# Patient Record
Sex: Female | Born: 1955 | Race: White | Hispanic: No | Marital: Married | Smoking: Former smoker
Health system: Southern US, Community
[De-identification: ages and names within clinical notes are randomized; demographics above are authoritative.]

## PROBLEM LIST (undated history)

## (undated) DIAGNOSIS — F32A Depression, unspecified: Secondary | ICD-10-CM

## (undated) DIAGNOSIS — Z8742 Personal history of other diseases of the female genital tract: Secondary | ICD-10-CM

## (undated) DIAGNOSIS — E039 Hypothyroidism, unspecified: Secondary | ICD-10-CM

## (undated) DIAGNOSIS — C4491 Basal cell carcinoma of skin, unspecified: Secondary | ICD-10-CM

## (undated) DIAGNOSIS — F419 Anxiety disorder, unspecified: Secondary | ICD-10-CM

## (undated) DIAGNOSIS — F329 Major depressive disorder, single episode, unspecified: Secondary | ICD-10-CM

## (undated) DIAGNOSIS — G43909 Migraine, unspecified, not intractable, without status migrainosus: Secondary | ICD-10-CM

## (undated) DIAGNOSIS — M1990 Unspecified osteoarthritis, unspecified site: Secondary | ICD-10-CM

## (undated) HISTORY — PX: CHOLECYSTECTOMY: SHX55

## (undated) HISTORY — PX: SKIN CANCER EXCISION: SHX779

## (undated) HISTORY — PX: TUBAL LIGATION: SHX77

## (undated) HISTORY — PX: COLONOSCOPY: SHX174

---
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
## 2016-11-27 HISTORY — PX: ABDOMINAL HYSTERECTOMY: SHX81

## 2018-09-26 ENCOUNTER — Ambulatory Visit: Payer: Self-pay | Admitting: Internal Medicine

## 2019-12-09 ENCOUNTER — Encounter: Payer: Self-pay | Admitting: Orthopedic Surgery

## 2019-12-09 NOTE — H&P (Signed)
TOTAL HIP ADMISSION H&P  Patient is admitted for right total hip arthroplasty.  Subjective:  Chief Complaint: right hip pain  HPI: Debbie Lin, 64 y.o. female, has a history of pain and functional disability in the right hip(s) due to arthritis and patient has failed non-surgical conservative treatments for greater than 12 weeks to include corticosteriod injections and activity modification.  Onset of symptoms was gradual starting several years ago with gradually worsening course since that time.The patient noted no past surgery on the right hip(s).  Patient currently rates pain in the right hip at 9 out of 10 with activity. Patient has worsening of pain with activity and weight bearing, pain that interfers with activities of daily living and instability. Patient has evidence of bone on bone arthritis throughout the right hip with large marginal osteophyte formation by imaging studies. This condition presents safety issues increasing the risk of falls. There is no current active infection.  There are no problems to display for this patient.  History reviewed. No pertinent past medical history.  History reviewed. No pertinent surgical history.  No current facility-administered medications for this encounter.   No current outpatient medications on file.   Not on File  Social History   Tobacco Use  . Smoking status: Not on file  Substance Use Topics  . Alcohol use: Not on file    History reviewed. No pertinent family history.   Review of Systems  Constitutional: Negative for chills and fever.  HENT: Negative for congestion, sore throat and tinnitus.   Eyes: Negative for photophobia and pain.  Respiratory: Negative for cough, shortness of breath and wheezing.   Cardiovascular: Negative for chest pain and palpitations.  Gastrointestinal: Negative for nausea and vomiting.  Genitourinary: Negative for dysuria, frequency and urgency.  Neurological: Negative for dizziness, weakness and  headaches.    Objective:  Physical Exam  Well nourished and well developed.  General: Alert and oriented x3, cooperative and pleasant, no acute distress.  Head: normocephalic, atraumatic, neck supple.  Eyes: EOMI.  Respiratory: breath sounds clear in all fields, no wheezing, rales, or rhonchi. Cardiovascular: Regular rate and rhythm, no murmurs, gallops or rubs.  Abdomen: non-tender to palpation and soft, normoactive bowel sounds. Musculoskeletal:  Right Hip Exam: ROM: Flexion to 100, Internal Rotation 0, External Rotation 20, and abduction 20 without discomfort. There is no tenderness over the greater trochanter bursa.  Calves soft and nontender. Motor function intact in LE. Strength 5/5 LE bilaterally. Neuro: Distal pulses 2+. Sensation to light touch intact in LE.   Vital signs in last 24 hours: Blood pressure: 130/88 mmHg Pulse: 72 bpm  Labs:   There is no height or weight on file to calculate BMI.   Imaging Review Plain radiographs demonstrate severe degenerative joint disease of the right hip(s). The bone quality appears to be adequate for age and reported activity level.  Assessment/Plan:  End stage arthritis, right hip(s)  The patient history, physical examination, clinical judgement of the provider and imaging studies are consistent with end stage degenerative joint disease of the right hip(s) and total hip arthroplasty is deemed medically necessary. The treatment options including medical management, injection therapy, arthroscopy and arthroplasty were discussed at length. The risks and benefits of total hip arthroplasty were presented and reviewed. The risks due to aseptic loosening, infection, stiffness, dislocation/subluxation,  thromboembolic complications and other imponderables were discussed.  The patient acknowledged the explanation, agreed to proceed with the plan and consent was signed. Patient is being admitted for inpatient treatment  for surgery, pain  control, PT, OT, prophylactic antibiotics, VTE prophylaxis, progressive ambulation and ADL's and discharge planning.The patient is planning to be discharged home.   Anticipated LOS equal to or greater than 2 midnights due to - Age 61 and older with one or more of the following:  - Obesity  - Expected need for hospital services (PT, OT, Nursing) required for safe  discharge  - Anticipated need for postoperative skilled nursing care or inpatient rehab  - Active co-morbidities: None OR   - Unanticipated findings during/Post Surgery: None  - Patient is a high risk of re-admission due to: None  Therapy Plans: HEP Disposition: Home with husband Planned DVT Prophylaxis: Aspirin 325 mg BID DME needed: None PCP: Dr. Quintin Alto (has been cleared) TXA: IV Allergies: NKDA Anesthesia Concerns: None BMI: 31.1 Last HgbA1c: 5.5%  - Patient was instructed on what medications to stop prior to surgery. - Follow-up visit in 2 weeks with Dr. Wynelle Link - Begin physical therapy following surgery - Pre-operative lab work as pre-surgical testing - Prescriptions will be provided in hospital at time of discharge  Theresa Duty, PA-C Orthopedic Surgery [facility] [place]

## 2019-12-10 ENCOUNTER — Encounter (HOSPITAL_COMMUNITY): Payer: Self-pay

## 2019-12-10 NOTE — Patient Instructions (Addendum)
DUE TO COVID-19 ONLY ONE VISITOR IS ALLOWED TO COME WITH YOU AND STAY IN THE WAITING ROOM ONLY DURING PRE OP AND PROCEDURE. THE ONE VISITOR MAY VISIT WITH YOU IN YOUR PRIVATE ROOM DURING VISITING HOURS ONLY!!   COVID SWAB TESTING MUST BE COMPLETED ON: Saturday, Jan. 16, 2021 at 10:10 AM   (Must self quarantine after testing. Follow instructions on handout.)             Your procedure is scheduled on: Wednesday, Jan. 20, 2021   Report to [facility] Main  Entrance    Report to admitting at 11:25 AM   Call this number if you have problems the morning of surgery [phone]   Do not eat food  :After Midnight.   May have liquids until 10:55 AM day of surgery   CLEAR LIQUID DIET  Foods Allowed                                                                     Foods Excluded  Water, Black Coffee and tea, regular and decaf                             liquids that you cannot  Plain Jell-O in any flavor  (No red)                                           see through such as: Fruit ices (not with fruit pulp)                                     milk, soups, orange juice  Iced Popsicles (No red)                                    All solid food Carbonated beverages, regular and diet                                    Apple juices Sports drinks like Gatorade (No red) Lightly seasoned clear broth or consume(fat free) Sugar, honey syrup  Sample Menu Breakfast                                Lunch                                     Supper Cranberry juice                    Beef broth                            Chicken broth Jell-O  Grape juice                           Apple juice Coffee or tea                        Jell-O                                      Popsicle                                                Coffee or tea                        Coffee or tea   Complete one Ensure drink the morning of surgery at 10:55 AM the day of  surgery.   Brush your teeth the morning of surgery.   Do NOT smoke after Midnight   Take these medicines the morning of surgery with A SIP OF WATER: None              You may not have any metal on your body including hair pins, jewelry, and body piercings             Do not wear make-up, lotions, powders, perfumes/cologne, or deodorant             Do not wear nail polish.  Do not shave  48 hours prior to surgery.              Do not bring valuables to the hospital. [place].   Contacts, dentures or bridgework may not be worn into surgery.   Bring small overnight bag day of surgery.    Special Instructions: Bring a copy of your healthcare power of attorney and living will documents         the day of surgery if you haven't scanned them in before.              Please read over the following fact sheets you were given:  [facility] - Preparing for Surgery Before surgery, you can play an important role.  Because skin is not sterile, your skin needs to be as free of germs as possible.  You can reduce the number of germs on your skin by washing with CHG (chlorahexidine gluconate) soap before surgery.  CHG is an antiseptic cleaner which kills germs and bonds with the skin to continue killing germs even after washing. Please DO NOT use if you have an allergy to CHG or antibacterial soaps.  If your skin becomes reddened/irritated stop using the CHG and inform your nurse when you arrive at Short Stay. Do not shave (including legs and underarms) for at least 48 hours prior to the first CHG shower.  You may shave your face/neck.  Please follow these instructions carefully:  1.  Shower with CHG Soap the night before surgery and the  morning of surgery.  2.  If you choose to wash your hair, wash your hair first as usual with your normal  shampoo.  3.  After you shampoo,  rinse your hair and body thoroughly to remove the shampoo.                              4.  Use CHG as you would any other liquid soap.  You can apply chg directly to the skin and wash.  Gently with a scrungie or clean washcloth.  5.  Apply the CHG Soap to your body ONLY FROM THE NECK DOWN.   Do   not use on face/ open                           Wound or open sores. Avoid contact with eyes, ears mouth and   genitals (private parts).                       Wash face,  Genitals (private parts) with your normal soap.             6.  Wash thoroughly, paying special attention to the area where your    surgery  will be performed.  7.  Thoroughly rinse your body with warm water from the neck down.  8.  DO NOT shower/wash with your normal soap after using and rinsing off the CHG Soap.                9.  Pat yourself dry with a clean towel.            10.  Wear clean pajamas.            11.  Place clean sheets on your bed the night of your first shower and do not  sleep with pets. Day of Surgery : Do not apply any lotions/deodorants the morning of surgery.  Please wear clean clothes to the hospital/surgery center.  FAILURE TO FOLLOW THESE INSTRUCTIONS MAY RESULT IN THE CANCELLATION OF YOUR SURGERY  PATIENT SIGNATURE_________________________________  NURSE SIGNATURE__________________________________  ________________________________________________________________________   Debbie Lin  An incentive spirometer is a tool that can help keep your lungs clear and active. This tool measures how well you are filling your lungs with each breath. Taking long deep breaths may help reverse or decrease the chance of developing breathing (pulmonary) problems (especially infection) following:  A long period of time when you are unable to move or be active. BEFORE THE PROCEDURE   If the spirometer includes an indicator to show your best effort, your nurse or respiratory therapist will set it to a desired goal.  If possible, sit up straight or lean slightly forward. Try not to  slouch.  Hold the incentive spirometer in an upright position. INSTRUCTIONS FOR USE  1. Sit on the edge of your bed if possible, or sit up as far as you can in bed or on a chair. 2. Hold the incentive spirometer in an upright position. 3. Breathe out normally. 4. Place the mouthpiece in your mouth and seal your lips tightly around it. 5. Breathe in slowly and as deeply as possible, raising the piston or the ball toward the top of the column. 6. Hold your breath for 3-5 seconds or for as long as possible. Allow the piston or ball to fall to the bottom of the column. 7. Remove the mouthpiece from your mouth and breathe out normally. 8. Rest for a few seconds and repeat Steps 1 through 7 at least 10 times every 1-2 hours when  you are awake. Take your time and take a few normal breaths between deep breaths. 9. The spirometer may include an indicator to show your best effort. Use the indicator as a goal to work toward during each repetition. 10. After each set of 10 deep breaths, practice coughing to be sure your lungs are clear. If you have an incision (the cut made at the time of surgery), support your incision when coughing by placing a pillow or rolled up towels firmly against it. Once you are able to get out of bed, walk around indoors and cough well. You may stop using the incentive spirometer when instructed by your caregiver.  RISKS AND COMPLICATIONS  Take your time so you do not get dizzy or light-headed.  If you are in pain, you may need to take or ask for pain medication before doing incentive spirometry. It is harder to take a deep breath if you are having pain. AFTER USE  Rest and breathe slowly and easily.  It can be helpful to keep track of a log of your progress. Your caregiver can provide you with a simple table to help with this. If you are using the spirometer at home, follow these instructions: [place] IF:   You are having difficultly using the spirometer.  You  have trouble using the spirometer as often as instructed.  Your pain medication is not giving enough relief while using the spirometer.  You develop fever of 100.5 F (38.1 C) or higher. SEEK IMMEDIATE MEDICAL CARE IF:   You cough up bloody sputum that had not been present before.  You develop fever of 102 F (38.9 C) or greater.  You develop worsening pain at or near the incision site. MAKE SURE YOU:   Understand these instructions.  Will watch your condition.  Will get help right away if you are not doing well or get worse. Document Released: 03/26/2007 Document Revised: 02/05/2012 Document Reviewed: 05/27/2007 [employer] Patient Information 2014 [employer], [place].   ________________________________________________________________________  WHAT IS A BLOOD TRANSFUSION? Blood Transfusion Information  A transfusion is the replacement of blood or some of its parts. Blood is made up of multiple cells which provide different functions.  Red blood cells carry oxygen and are used for blood loss replacement.  White blood cells fight against infection.  Platelets control bleeding.  Plasma helps clot blood.  Other blood products are available for specialized needs, such as hemophilia or other clotting disorders. BEFORE THE TRANSFUSION  Who gives blood for transfusions?   Healthy volunteers who are fully evaluated to make sure their blood is safe. This is blood bank blood. Transfusion therapy is the safest it has ever been in the practice of medicine. Before blood is taken from a donor, a complete history is taken to make sure that person has no history of diseases nor engages in risky social behavior (examples are intravenous drug use or sexual activity with multiple partners). The donor's travel history is screened to minimize risk of transmitting infections, such as malaria. The donated blood is tested for signs of infectious diseases, such as HIV and hepatitis. The blood is then  tested to be sure it is compatible with you in order to minimize the chance of a transfusion reaction. If you or a relative donates blood, this is often done in anticipation of surgery and is not appropriate for emergency situations. It takes many days to process the donated blood. RISKS AND COMPLICATIONS Although transfusion therapy is very safe and saves many  lives, the main dangers of transfusion include:   Getting an infectious disease.  Developing a transfusion reaction. This is an allergic reaction to something in the blood you were given. Every precaution is taken to prevent this. The decision to have a blood transfusion has been considered carefully by your caregiver before blood is given. Blood is not given unless the benefits outweigh the risks. AFTER THE TRANSFUSION  Right after receiving a blood transfusion, you will usually feel much better and more energetic. This is especially true if your red blood cells have gotten low (anemic). The transfusion raises the level of the red blood cells which carry oxygen, and this usually causes an energy increase.  The nurse administering the transfusion will monitor you carefully for complications. HOME CARE INSTRUCTIONS  No special instructions are needed after a transfusion. You may find your energy is better. Speak with your caregiver about any limitations on activity for underlying diseases you may have. SEEK MEDICAL CARE IF:   Your condition is not improving after your transfusion.  You develop redness or irritation at the intravenous (IV) site. SEEK IMMEDIATE MEDICAL CARE IF:  Any of the following symptoms occur over the next 12 hours:  Shaking chills.  You have a temperature by mouth above 102 F (38.9 C), not controlled by medicine.  Chest, back, or muscle pain.  People around you feel you are not acting correctly or are confused.  Shortness of breath or difficulty breathing.  Dizziness and fainting.  You get a rash or  develop hives.  You have a decrease in urine output.  Your urine turns a dark color or changes to pink, red, or brown. Any of the following symptoms occur over the next 10 days:  You have a temperature by mouth above 102 F (38.9 C), not controlled by medicine.  Shortness of breath.  Weakness after normal activity.  The white part of the eye turns yellow (jaundice).  You have a decrease in the amount of urine or are urinating less often.  Your urine turns a dark color or changes to pink, red, or brown. Document Released: 11/10/2000 Document Revised: 02/05/2012 Document Reviewed: 06/29/2008 [facility] Patient Information 2014 [place], [place].  _______________________________________________________________________

## 2019-12-13 ENCOUNTER — Other Ambulatory Visit (HOSPITAL_COMMUNITY)
Admission: RE | Admit: 2019-12-13 | Discharge: 2019-12-13 | Disposition: A | Discharge status: Home / Self Care | Payer: PRIVATE HEALTH INSURANCE | Source: Ambulatory Visit | Attending: Orthopedic Surgery | Admitting: Orthopedic Surgery

## 2019-12-13 DIAGNOSIS — Z20822 Contact with and (suspected) exposure to covid-19: Secondary | ICD-10-CM | POA: Insufficient documentation

## 2019-12-13 DIAGNOSIS — Z01812 Encounter for preprocedural laboratory examination: Secondary | ICD-10-CM | POA: Diagnosis not present

## 2019-12-14 LAB — NOVEL CORONAVIRUS, NAA (HOSP ORDER, SEND-OUT TO REF LAB; TAT 18-24 HRS): SARS-CoV-2, NAA: NOT DETECTED

## 2019-12-15 ENCOUNTER — Other Ambulatory Visit: Payer: Self-pay

## 2019-12-15 ENCOUNTER — Encounter (HOSPITAL_COMMUNITY)
Admission: RE | Admit: 2019-12-15 | Discharge: 2019-12-15 | Disposition: A | Discharge status: Home / Self Care | Payer: PRIVATE HEALTH INSURANCE | Source: Ambulatory Visit | Attending: Orthopedic Surgery | Admitting: Orthopedic Surgery

## 2019-12-15 ENCOUNTER — Encounter (HOSPITAL_COMMUNITY): Payer: Self-pay

## 2019-12-15 DIAGNOSIS — Z01812 Encounter for preprocedural laboratory examination: Secondary | ICD-10-CM | POA: Diagnosis not present

## 2019-12-15 HISTORY — DX: Depression, unspecified: F32.A

## 2019-12-15 HISTORY — DX: Anxiety disorder, unspecified: F41.9

## 2019-12-15 HISTORY — DX: Basal cell carcinoma of skin, unspecified: C44.91

## 2019-12-15 HISTORY — DX: Personal history of other diseases of the female genital tract: Z87.42

## 2019-12-15 HISTORY — DX: Unspecified osteoarthritis, unspecified site: M19.90

## 2019-12-15 HISTORY — DX: Hypothyroidism, unspecified: E03.9

## 2019-12-15 HISTORY — DX: Migraine, unspecified, not intractable, without status migrainosus: G43.909

## 2019-12-15 LAB — ABO/RH: ABO/RH(D): AB POS

## 2019-12-15 LAB — PROTIME-INR
INR: 1 (ref 0.8–1.2)
Prothrombin Time: 13.2 seconds (ref 11.4–15.2)

## 2019-12-15 LAB — COMPREHENSIVE METABOLIC PANEL
ALT: 14 U/L (ref 0–44)
AST: 19 U/L (ref 15–41)
Albumin: 4.6 g/dL (ref 3.5–5.0)
Alkaline Phosphatase: 66 U/L (ref 38–126)
Anion gap: 9 (ref 5–15)
BUN: 16 mg/dL (ref 8–23)
CO2: 28 mmol/L (ref 22–32)
Calcium: 9.4 mg/dL (ref 8.9–10.3)
Chloride: 102 mmol/L (ref 98–111)
Creatinine, Ser: 0.8 mg/dL (ref 0.44–1.00)
GFR calc Af Amer: >60 mL/min (ref >60)
GFR calc non Af Amer: >60 mL/min (ref >60)
Glucose, Bld: 97 mg/dL (ref 70–99)
Potassium: 4.5 mmol/L (ref 3.5–5.1)
Sodium: 139 mmol/L (ref 135–145)
Total Bilirubin: 0.9 mg/dL (ref 0.3–1.2)
Total Protein: 7.9 g/dL (ref 6.5–8.1)

## 2019-12-15 LAB — SURGICAL PCR SCREEN
MRSA, PCR: NEGATIVE
Staphylococcus aureus: POSITIVE — AB

## 2019-12-15 LAB — CBC
HCT: 42 % (ref 36.0–46.0)
Hemoglobin: 13.4 g/dL (ref 12.0–15.0)
MCH: 28.8 pg (ref 26.0–34.0)
MCHC: 31.9 g/dL (ref 30.0–36.0)
MCV: 90.3 fL (ref 80.0–100.0)
Platelets: 264 10*3/uL (ref 150–400)
RBC: 4.65 MIL/uL (ref 3.87–5.11)
RDW: 13.2 % (ref 11.5–15.5)
WBC: 7.4 10*3/uL (ref 4.0–10.5)
nRBC: 0 % (ref 0.0–0.2)

## 2019-12-15 LAB — APTT: aPTT: 28 seconds (ref 24–36)

## 2019-12-15 NOTE — Progress Notes (Signed)
PCP - Dr. Jeanelle Malling [facility] [place], [place] Cardiologist - N/A  Chest x-ray - greater than 1 year EKG - at Dr. Edythe Lynn office Stress Test - N/A ECHO - N/A Cardiac Cath - N/A  Sleep Study - N/A CPAP - N/A  Fasting Blood Sugar - N/A Checks Blood Sugar __N/A___ times a day  Blood Thinner Instructions:  N/A Aspirin Instructions: N/A Last Dose: N/A  Anesthesia review:  N/A  Patient denies shortness of breath, fever, cough and chest pain at PAT appointment   Patient verbalized understanding of instructions that were given to them at the PAT appointment. Patient was also instructed that they will need to review over the PAT instructions again at home before surgery.

## 2019-12-17 ENCOUNTER — Telehealth (HOSPITAL_COMMUNITY): Payer: Self-pay | Admitting: *Deleted

## 2019-12-17 ENCOUNTER — Encounter (HOSPITAL_COMMUNITY): Payer: Self-pay | Admitting: Orthopedic Surgery

## 2019-12-17 ENCOUNTER — Ambulatory Visit (HOSPITAL_COMMUNITY): Payer: PRIVATE HEALTH INSURANCE

## 2019-12-17 ENCOUNTER — Encounter (HOSPITAL_COMMUNITY)
Admission: RE | Disposition: A | Discharge status: Home / Self Care | Payer: Self-pay | Source: Ambulatory Visit | Attending: Orthopedic Surgery

## 2019-12-17 ENCOUNTER — Ambulatory Visit (HOSPITAL_COMMUNITY)
Admission: RE | Admit: 2019-12-17 | Discharge: 2019-12-18 | Disposition: A | Discharge status: Home / Self Care | Payer: PRIVATE HEALTH INSURANCE | Source: Ambulatory Visit | Attending: Orthopedic Surgery | Admitting: Orthopedic Surgery

## 2019-12-17 ENCOUNTER — Ambulatory Visit (HOSPITAL_COMMUNITY): Payer: PRIVATE HEALTH INSURANCE | Admitting: Physician Assistant

## 2019-12-17 DIAGNOSIS — Z87891 Personal history of nicotine dependence: Secondary | ICD-10-CM | POA: Insufficient documentation

## 2019-12-17 DIAGNOSIS — E039 Hypothyroidism, unspecified: Secondary | ICD-10-CM | POA: Diagnosis not present

## 2019-12-17 DIAGNOSIS — M1990 Unspecified osteoarthritis, unspecified site: Secondary | ICD-10-CM | POA: Insufficient documentation

## 2019-12-17 DIAGNOSIS — Z419 Encounter for procedure for purposes other than remedying health state, unspecified: Secondary | ICD-10-CM

## 2019-12-17 DIAGNOSIS — M1611 Unilateral primary osteoarthritis, right hip: Secondary | ICD-10-CM | POA: Diagnosis not present

## 2019-12-17 DIAGNOSIS — F419 Anxiety disorder, unspecified: Secondary | ICD-10-CM | POA: Insufficient documentation

## 2019-12-17 DIAGNOSIS — Z96641 Presence of right artificial hip joint: Secondary | ICD-10-CM

## 2019-12-17 DIAGNOSIS — F329 Major depressive disorder, single episode, unspecified: Secondary | ICD-10-CM | POA: Diagnosis not present

## 2019-12-17 DIAGNOSIS — Z96649 Presence of unspecified artificial hip joint: Secondary | ICD-10-CM

## 2019-12-17 DIAGNOSIS — M169 Osteoarthritis of hip, unspecified: Secondary | ICD-10-CM | POA: Diagnosis present

## 2019-12-17 HISTORY — PX: TOTAL HIP ARTHROPLASTY: SHX124

## 2019-12-17 LAB — TYPE AND SCREEN
ABO/RH(D): AB POS
Antibody Screen: NEGATIVE

## 2019-12-17 SURGERY — ARTHROPLASTY, HIP, TOTAL, ANTERIOR APPROACH
Anesthesia: Spinal | Site: Hip | Laterality: Right

## 2019-12-17 MED ORDER — DEXAMETHASONE SODIUM PHOSPHATE 10 MG/ML IJ SOLN
8.0000 mg | Freq: Once | INTRAMUSCULAR | Status: AC
  Administered 2019-12-17: 13:00:00 8 mg via INTRAVENOUS

## 2019-12-17 MED ORDER — ALPRAZOLAM 0.5 MG PO TABS: 0.5000 mg | ORAL_TABLET | Freq: Every evening | ORAL | Status: DC | PRN

## 2019-12-17 MED ORDER — FENTANYL CITRATE (PF) 100 MCG/2ML IJ SOLN
INTRAMUSCULAR | Status: AC
  Filled 2019-12-17: qty 2

## 2019-12-17 MED ORDER — LIDOCAINE 2% (20 MG/ML) 5 ML SYRINGE
INTRAMUSCULAR | Status: AC
  Filled 2019-12-17: qty 5

## 2019-12-17 MED ORDER — POVIDONE-IODINE 10 % EX SWAB: 2.0000 "application " | Freq: Once | CUTANEOUS | Status: DC

## 2019-12-17 MED ORDER — BUPIVACAINE HCL (PF) 0.25 % IJ SOLN
INTRAMUSCULAR | Status: AC
  Filled 2019-12-17: qty 30

## 2019-12-17 MED ORDER — METHOCARBAMOL 500 MG PO TABS
500.0000 mg | ORAL_TABLET | Freq: Four times a day (QID) | ORAL | Status: DC | PRN
  Administered 2019-12-17 – 2019-12-18 (×3): 500 mg via ORAL
  Filled 2019-12-17 (×3): qty 1

## 2019-12-17 MED ORDER — PROPOFOL 10 MG/ML IV BOLUS
INTRAVENOUS | Status: AC
  Filled 2019-12-17: qty 20

## 2019-12-17 MED ORDER — DIPHENHYDRAMINE HCL 12.5 MG/5ML PO ELIX: 12.5000 mg | ORAL_SOLUTION | ORAL | Status: DC | PRN

## 2019-12-17 MED ORDER — MIDAZOLAM HCL 2 MG/2ML IJ SOLN
INTRAMUSCULAR | Status: AC
  Filled 2019-12-17: qty 2

## 2019-12-17 MED ORDER — BUPIVACAINE HCL 0.25 % IJ SOLN
INTRAMUSCULAR | Status: DC | PRN
  Administered 2019-12-17: 30 mL

## 2019-12-17 MED ORDER — CEFAZOLIN SODIUM-DEXTROSE 2-4 GM/100ML-% IV SOLN
2.0000 g | Freq: Four times a day (QID) | INTRAVENOUS | Status: AC
  Administered 2019-12-17 – 2019-12-18 (×2): 2 g via INTRAVENOUS
  Filled 2019-12-17 (×2): qty 100

## 2019-12-17 MED ORDER — PHENOL 1.4 % MT LIQD: 1.0000 | OROMUCOSAL | Status: DC | PRN

## 2019-12-17 MED ORDER — CHLORHEXIDINE GLUCONATE 4 % EX LIQD: 60.0000 mL | Freq: Once | CUTANEOUS | Status: DC

## 2019-12-17 MED ORDER — ONDANSETRON HCL 4 MG/2ML IJ SOLN
INTRAMUSCULAR | Status: AC
  Filled 2019-12-17: qty 2

## 2019-12-17 MED ORDER — METOCLOPRAMIDE HCL 5 MG PO TABS: 5.0000 mg | ORAL_TABLET | Freq: Three times a day (TID) | ORAL | Status: DC | PRN

## 2019-12-17 MED ORDER — ALBUMIN HUMAN 5 % IV SOLN
INTRAVENOUS | Status: AC
  Filled 2019-12-17: qty 500

## 2019-12-17 MED ORDER — METOCLOPRAMIDE HCL 5 MG/ML IJ SOLN: 5.0000 mg | Freq: Three times a day (TID) | INTRAMUSCULAR | Status: DC | PRN

## 2019-12-17 MED ORDER — FENTANYL CITRATE (PF) 100 MCG/2ML IJ SOLN
INTRAMUSCULAR | Status: DC | PRN
  Administered 2019-12-17: 25 ug via INTRAVENOUS

## 2019-12-17 MED ORDER — HYDROCODONE-ACETAMINOPHEN 7.5-325 MG PO TABS: 1.0000 | ORAL_TABLET | ORAL | Status: DC | PRN

## 2019-12-17 MED ORDER — MENTHOL 3 MG MT LOZG: 1.0000 | LOZENGE | OROMUCOSAL | Status: DC | PRN

## 2019-12-17 MED ORDER — ACETAMINOPHEN 10 MG/ML IV SOLN
1000.0000 mg | Freq: Four times a day (QID) | INTRAVENOUS | Status: DC
  Administered 2019-12-17: 13:00:00 1000 mg via INTRAVENOUS
  Filled 2019-12-17: qty 100

## 2019-12-17 MED ORDER — HYDROCODONE-ACETAMINOPHEN 5-325 MG PO TABS
1.0000 | ORAL_TABLET | ORAL | Status: DC | PRN
  Administered 2019-12-17 – 2019-12-18 (×4): 2 via ORAL
  Filled 2019-12-17 (×4): qty 2

## 2019-12-17 MED ORDER — FLUOXETINE HCL 20 MG PO CAPS
40.0000 mg | ORAL_CAPSULE | Freq: Every day | ORAL | Status: DC
  Administered 2019-12-17: 20:00:00 40 mg via ORAL
  Filled 2019-12-17: qty 2

## 2019-12-17 MED ORDER — HYDROMORPHONE HCL 1 MG/ML IJ SOLN
INTRAMUSCULAR | Status: AC
  Filled 2019-12-17: qty 1

## 2019-12-17 MED ORDER — ACETAMINOPHEN 325 MG PO TABS: 325.0000 mg | ORAL_TABLET | Freq: Four times a day (QID) | ORAL | Status: DC | PRN

## 2019-12-17 MED ORDER — MIDAZOLAM HCL 5 MG/5ML IJ SOLN
INTRAMUSCULAR | Status: DC | PRN
  Administered 2019-12-17: 1 mg via INTRAVENOUS
  Administered 2019-12-17: 2 mg via INTRAVENOUS

## 2019-12-17 MED ORDER — SODIUM CHLORIDE 0.9 % IV SOLN: INTRAVENOUS | Status: DC

## 2019-12-17 MED ORDER — WATER FOR IRRIGATION, STERILE IR SOLN
Status: DC | PRN
  Administered 2019-12-17: 1000 mL

## 2019-12-17 MED ORDER — DOCUSATE SODIUM 100 MG PO CAPS
100.0000 mg | ORAL_CAPSULE | Freq: Two times a day (BID) | ORAL | Status: DC
  Administered 2019-12-17 – 2019-12-18 (×2): 100 mg via ORAL
  Filled 2019-12-17 (×2): qty 1

## 2019-12-17 MED ORDER — METHOCARBAMOL 500 MG IVPB - SIMPLE MED
500.0000 mg | Freq: Four times a day (QID) | INTRAVENOUS | Status: DC | PRN
  Administered 2019-12-17: 15:00:00 500 mg via INTRAVENOUS
  Filled 2019-12-17: qty 50

## 2019-12-17 MED ORDER — ONDANSETRON HCL 4 MG/2ML IJ SOLN: 4.0000 mg | Freq: Four times a day (QID) | INTRAMUSCULAR | Status: DC | PRN

## 2019-12-17 MED ORDER — MORPHINE SULFATE (PF) 4 MG/ML IV SOLN: 0.5000 mg | INTRAVENOUS | Status: DC | PRN

## 2019-12-17 MED ORDER — CEFAZOLIN SODIUM-DEXTROSE 2-4 GM/100ML-% IV SOLN
2.0000 g | INTRAVENOUS | Status: AC
  Administered 2019-12-17: 2 g via INTRAVENOUS
  Filled 2019-12-17: qty 100

## 2019-12-17 MED ORDER — ASPIRIN EC 325 MG PO TBEC
325.0000 mg | DELAYED_RELEASE_TABLET | Freq: Two times a day (BID) | ORAL | Status: DC
  Administered 2019-12-18: 08:00:00 325 mg via ORAL
  Filled 2019-12-17: qty 1

## 2019-12-17 MED ORDER — PHENYLEPHRINE HCL-NACL 10-0.9 MG/250ML-% IV SOLN
INTRAVENOUS | Status: DC | PRN
  Administered 2019-12-17: 10 ug/min via INTRAVENOUS

## 2019-12-17 MED ORDER — PROPOFOL 500 MG/50ML IV EMUL
INTRAVENOUS | Status: DC | PRN
  Administered 2019-12-17: 30 ug/kg/min via INTRAVENOUS

## 2019-12-17 MED ORDER — TRANEXAMIC ACID-NACL 1000-0.7 MG/100ML-% IV SOLN
1000.0000 mg | INTRAVENOUS | Status: AC
  Administered 2019-12-17: 12:00:00 1000 mg via INTRAVENOUS
  Filled 2019-12-17: qty 100

## 2019-12-17 MED ORDER — PROPOFOL 500 MG/50ML IV EMUL
INTRAVENOUS | Status: DC | PRN
  Administered 2019-12-17: 20 ug via INTRAVENOUS
  Administered 2019-12-17: 30 ug via INTRAVENOUS

## 2019-12-17 MED ORDER — HYDROMORPHONE HCL 1 MG/ML IJ SOLN
0.2500 mg | INTRAMUSCULAR | Status: DC | PRN
  Administered 2019-12-17: 15:00:00 0.5 mg via INTRAVENOUS

## 2019-12-17 MED ORDER — DEXAMETHASONE SODIUM PHOSPHATE 10 MG/ML IJ SOLN
INTRAMUSCULAR | Status: AC
  Filled 2019-12-17: qty 1

## 2019-12-17 MED ORDER — ONDANSETRON HCL 4 MG/2ML IJ SOLN
INTRAMUSCULAR | Status: DC | PRN
  Administered 2019-12-17: 4 mg via INTRAVENOUS

## 2019-12-17 MED ORDER — BUPIVACAINE IN DEXTROSE 0.75-8.25 % IT SOLN
INTRATHECAL | Status: DC | PRN
  Administered 2019-12-17: 1.8 mL via INTRATHECAL

## 2019-12-17 MED ORDER — PHENYLEPHRINE 40 MCG/ML (10ML) SYRINGE FOR IV PUSH (FOR BLOOD PRESSURE SUPPORT)
PREFILLED_SYRINGE | INTRAVENOUS | Status: AC
  Filled 2019-12-17: qty 10

## 2019-12-17 MED ORDER — METHOCARBAMOL 500 MG IVPB - SIMPLE MED
INTRAVENOUS | Status: AC
  Filled 2019-12-17: qty 50

## 2019-12-17 MED ORDER — LACTATED RINGERS IV SOLN: INTRAVENOUS | Status: DC

## 2019-12-17 MED ORDER — TRANEXAMIC ACID-NACL 1000-0.7 MG/100ML-% IV SOLN
1000.0000 mg | Freq: Once | INTRAVENOUS | Status: AC
  Administered 2019-12-17: 1000 mg via INTRAVENOUS
  Filled 2019-12-17: qty 100

## 2019-12-17 MED ORDER — MAGNESIUM CITRATE PO SOLN: 1.0000 | Freq: Once | ORAL | Status: DC | PRN

## 2019-12-17 MED ORDER — EPHEDRINE 5 MG/ML INJ
INTRAVENOUS | Status: AC
  Filled 2019-12-17: qty 10

## 2019-12-17 MED ORDER — ONDANSETRON HCL 4 MG PO TABS: 4.0000 mg | ORAL_TABLET | Freq: Four times a day (QID) | ORAL | Status: DC | PRN

## 2019-12-17 MED ORDER — BISACODYL 10 MG RE SUPP: 10.0000 mg | Freq: Every day | RECTAL | Status: DC | PRN

## 2019-12-17 MED ORDER — LEVOTHYROXINE SODIUM 50 MCG PO TABS
50.0000 ug | ORAL_TABLET | Freq: Every day | ORAL | Status: DC
  Administered 2019-12-17: 50 ug via ORAL
  Filled 2019-12-17: qty 1

## 2019-12-17 MED ORDER — POLYETHYLENE GLYCOL 3350 17 G PO PACK: 17.0000 g | PACK | Freq: Every day | ORAL | Status: DC | PRN

## 2019-12-17 MED ORDER — ALBUMIN HUMAN 5 % IV SOLN: INTRAVENOUS | Status: DC | PRN

## 2019-12-17 MED ORDER — 0.9 % SODIUM CHLORIDE (POUR BTL) OPTIME
TOPICAL | Status: DC | PRN
  Administered 2019-12-17: 1000 mL

## 2019-12-17 SURGICAL SUPPLY — 50 items
BAG DECANTER FOR FLEXI CONT (MISCELLANEOUS) IMPLANT
BAG ZIPLOCK 12X15 (MISCELLANEOUS) IMPLANT
BLADE SAG 18X100X1.27 (BLADE) ×3 IMPLANT
CLOSURE STERI-STRIP 1/2X4 (GAUZE/BANDAGES/DRESSINGS) ×1
CLOSURE WOUND 1/2 X4 (GAUZE/BANDAGES/DRESSINGS) ×1
CLSR STERI-STRIP ANTIMIC 1/2X4 (GAUZE/BANDAGES/DRESSINGS) ×1 IMPLANT
COVER PERINEAL POST (MISCELLANEOUS) ×3 IMPLANT
COVER SURGICAL LIGHT HANDLE (MISCELLANEOUS) ×3 IMPLANT
COVER WAND RF STERILE (DRAPES) IMPLANT
CUP ACETBLR 48 OD SECTOR II (Hips) ×2 IMPLANT
DECANTER SPIKE VIAL GLASS SM (MISCELLANEOUS) ×3 IMPLANT
DRAPE STERI IOBAN 125X83 (DRAPES) ×3 IMPLANT
DRAPE U-SHAPE 47X51 STRL (DRAPES) ×6 IMPLANT
DRESSING AQUACEL AG SP 3.5X6 (GAUZE/BANDAGES/DRESSINGS) IMPLANT
DRSG ADAPTIC 3X8 NADH LF (GAUZE/BANDAGES/DRESSINGS) ×3 IMPLANT
DRSG AQUACEL AG SP 3.5X6 (GAUZE/BANDAGES/DRESSINGS) ×3
DRSG MEPILEX BORDER 4X4 (GAUZE/BANDAGES/DRESSINGS) ×3 IMPLANT
DRSG MEPILEX BORDER 4X8 (GAUZE/BANDAGES/DRESSINGS) ×3 IMPLANT
DURAPREP 26ML APPLICATOR (WOUND CARE) ×3 IMPLANT
ELECT REM PT RETURN 15FT ADLT (MISCELLANEOUS) ×3 IMPLANT
EVACUATOR 1/8 PVC DRAIN (DRAIN) ×3 IMPLANT
GLOVE BIO SURGEON STRL SZ 6 (GLOVE) IMPLANT
GLOVE BIO SURGEON STRL SZ7 (GLOVE) IMPLANT
GLOVE BIO SURGEON STRL SZ8 (GLOVE) ×3 IMPLANT
GLOVE BIOGEL PI IND STRL 6.5 (GLOVE) IMPLANT
GLOVE BIOGEL PI IND STRL 7.0 (GLOVE) IMPLANT
GLOVE BIOGEL PI IND STRL 8 (GLOVE) ×1 IMPLANT
GLOVE BIOGEL PI INDICATOR 6.5 (GLOVE)
GLOVE BIOGEL PI INDICATOR 7.0 (GLOVE)
GLOVE BIOGEL PI INDICATOR 8 (GLOVE) ×2
GOWN STRL REUS W/TWL LRG LVL3 (GOWN DISPOSABLE) ×3 IMPLANT
GOWN STRL REUS W/TWL XL LVL3 (GOWN DISPOSABLE) IMPLANT
HEAD CERAMIC DELTA 28M 12/14P5 (Head) ×2 IMPLANT
HOLDER FOLEY CATH W/STRAP (MISCELLANEOUS) ×3 IMPLANT
KIT TURNOVER KIT A (KITS) IMPLANT
LINER MARATHON 28 48 (Hips) ×2 IMPLANT
MANIFOLD NEPTUNE II (INSTRUMENTS) ×3 IMPLANT
PACK ANTERIOR HIP CUSTOM (KITS) ×3 IMPLANT
PENCIL SMOKE EVACUATOR COATED (MISCELLANEOUS) ×3 IMPLANT
STEM FEMORAL SZ 5MM STD ACTIS (Stem) ×2 IMPLANT
STRIP CLOSURE SKIN 1/2X4 (GAUZE/BANDAGES/DRESSINGS) ×2 IMPLANT
SUT ETHIBOND NAB CT1 #1 30IN (SUTURE) ×3 IMPLANT
SUT MNCRL AB 4-0 PS2 18 (SUTURE) ×3 IMPLANT
SUT STRATAFIX 0 PDS 27 VIOLET (SUTURE) ×3
SUT VIC AB 2-0 CT1 27 (SUTURE) ×4
SUT VIC AB 2-0 CT1 TAPERPNT 27 (SUTURE) ×2 IMPLANT
SUTURE STRATFX 0 PDS 27 VIOLET (SUTURE) ×1 IMPLANT
SYR 50ML LL SCALE MARK (SYRINGE) IMPLANT
TRAY FOLEY MTR SLVR 16FR STAT (SET/KITS/TRAYS/PACK) ×3 IMPLANT
YANKAUER SUCT BULB TIP 10FT TU (MISCELLANEOUS) ×3 IMPLANT

## 2019-12-17 NOTE — Transfer of Care (Signed)
Immediate Anesthesia Transfer of Care Note  Patient: Debbie Lin  Procedure(s) Performed: TOTAL HIP ARTHROPLASTY ANTERIOR APPROACH (Right Hip)  Patient Location: PACU  Anesthesia Type:Spinal  Level of Consciousness: awake, oriented and patient cooperative  Airway & Oxygen Therapy: Patient Spontanous Breathing and Patient connected to face mask oxygen  Post-op Assessment: Report given to RN and Post -op Vital signs reviewed and stable  Post vital signs: Reviewed and stable  Last Vitals:  Vitals Value Taken Time  BP    Temp    Pulse 81 12/17/19 1350  Resp    SpO2 100 % 12/17/19 1350  Vitals shown include unvalidated device data.  Last Pain:  Vitals:   12/17/19 1139  TempSrc:   PainSc: 0-No pain         Complications: No apparent anesthesia complications

## 2019-12-17 NOTE — Anesthesia Preprocedure Evaluation (Addendum)
Anesthesia Evaluation  Patient identified by MRN, date of birth, ID band Patient awake    Reviewed: Allergy & Precautions, NPO status , Patient's Chart, lab work & pertinent test results  History of Anesthesia Complications (+) POST - OP SPINAL HEADACHE  Airway Mallampati: II  TM Distance: >3 FB     Dental   Pulmonary former smoker,    breath sounds clear to auscultation       Cardiovascular negative cardio ROS   Rhythm:Regular Rate:Normal     Neuro/Psych  Headaches, Anxiety Depression    GI/Hepatic negative GI ROS, Neg liver ROS,   Endo/Other  Hypothyroidism   Renal/GU      Musculoskeletal  (+) Arthritis ,   Abdominal   Peds  Hematology   Anesthesia Other Findings   Reproductive/Obstetrics                           Anesthesia Physical Anesthesia Plan  ASA: III  Anesthesia Plan: Spinal   Post-op Pain Management:    Induction: Intravenous  PONV Risk Score and Plan: 2 and Ondansetron and Midazolam  Airway Management Planned: Nasal Cannula and Simple Face Mask  Additional Equipment:   Intra-op Plan:   Post-operative Plan:   Informed Consent: I have reviewed the patients History and Physical, chart, labs and discussed the procedure including the risks, benefits and alternatives for the proposed anesthesia with the patient or authorized representative who has indicated his/her understanding and acceptance.     Dental advisory given  Plan Discussed with: CRNA and Anesthesiologist  Anesthesia Plan Comments:         Anesthesia Quick Evaluation

## 2019-12-17 NOTE — Interval H&P Note (Signed)
History and Physical Interval Note:  12/17/2019 11:59 AM  Debbie Lin  has presented today for surgery, with the diagnosis of right hip osteoarthritis.  The various methods of treatment have been discussed with the patient and family. After consideration of risks, benefits and other options for treatment, the patient has consented to  Procedure(s) with comments: [place] (Right) - 166min as a surgical intervention.  The patient's history has been reviewed, patient examined, no change in status, stable for surgery.  I have reviewed the patient's chart and labs.  Questions were answered to the patient's satisfaction.     Pilar Plate Makenzy Krist

## 2019-12-17 NOTE — Anesthesia Postprocedure Evaluation (Signed)
Anesthesia Post Note  Patient: Debbie Lin  Procedure(s) Performed: TOTAL HIP ARTHROPLASTY ANTERIOR APPROACH (Right Hip)     Patient location during evaluation: PACU Anesthesia Type: Spinal Level of consciousness: awake Pain management: pain level controlled Vital Signs Assessment: post-procedure vital signs reviewed and stable Respiratory status: spontaneous breathing Postop Assessment: no apparent nausea or vomiting Anesthetic complications: no    Last Vitals:  Vitals:   12/17/19 1507 12/17/19 1614  BP: 118/81 125/81  Pulse: 76 81  Resp:  16  Temp: 36.4 C 36.6 C  SpO2: 99% 100%    Last Pain:  Vitals:   12/17/19 1507  TempSrc: Oral  PainSc:                  [name]

## 2019-12-17 NOTE — Op Note (Signed)
OPERATIVE REPORT- TOTAL HIP ARTHROPLASTY   PREOPERATIVE DIAGNOSIS: Osteoarthritis of the Right hip.   POSTOPERATIVE DIAGNOSIS: Osteoarthritis of the Right  hip.   PROCEDURE: Right total hip arthroplasty, anterior approach.   SURGEON: Gaynelle Arabian, MD   ASSISTANT: Ardeen Jourdain, PA-C  ANESTHESIA:  Spinal  ESTIMATED BLOOD LOSS:-1000 mL    DRAINS: Hemovac x1.   COMPLICATIONS: None   CONDITION: PACU - hemodynamically stable.   BRIEF CLINICAL NOTE: Debbie Lin is a 64 y.o. female who has advanced end-  stage arthritis of their Right  hip with progressively worsening pain and  dysfunction.The patient has failed nonoperative management and presents for  total hip arthroplasty.   PROCEDURE IN DETAIL: After successful administration of spinal  anesthetic, the traction boots for the [facility] bed were placed on both  feet and the patient was placed onto the [facility] bed, boots placed into the leg  holders. The Right hip was then isolated from the perineum with plastic  drapes and prepped and draped in the usual sterile fashion. ASIS and  greater trochanter were marked and a oblique incision was made, starting  at about 1 cm lateral and 2 cm distal to the ASIS and coursing towards  the anterior cortex of the femur. The skin was cut with a 10 blade  through subcutaneous tissue to the level of the fascia overlying the  tensor fascia lata muscle. The fascia was then incised in line with the  incision at the junction of the anterior third and posterior 2/3rd. The  muscle was teased off the fascia and then the interval between the TFL  and the rectus was developed. The Hohmann retractor was then placed at  the top of the femoral neck over the capsule. The vessels overlying the  capsule were cauterized and the fat on top of the capsule was removed.  A Hohmann retractor was then placed anterior underneath the rectus  femoris to give exposure to the entire anterior capsule. A T-shaped   capsulotomy was performed. The edges were tagged and the femoral head  was identified.       Osteophytes are removed off the superior acetabulum.  The femoral neck was then cut in situ with an oscillating saw. Traction  was then applied to the left lower extremity utilizing the [facility]  traction. The femoral head was then removed. Retractors were placed  around the acetabulum and then circumferential removal of the labrum was  performed. Osteophytes were also removed. Reaming starts at 45 mm to  medialize and  Increased in 2 mm increments to 47 mm. We reamed in  approximately 40 degrees of abduction, 20 degrees anteversion. A 48 mm  pinnacle acetabular shell was then impacted in anatomic position under  fluoroscopic guidance with excellent purchase. We did not need to place  any additional dome screws. A 28 mm neutral + 4 marathon liner was then  placed into the acetabular shell.       The femoral lift was then placed along the lateral aspect of the femur  just distal to the vastus ridge. The leg was  externally rotated and capsule  was stripped off the inferior aspect of the femoral neck down to the  level of the lesser trochanter, this was done with electrocautery. The femur was lifted after this was performed. The  leg was then placed in an extended and adducted position essentially delivering the femur. We also removed the capsule superiorly and the piriformis from the piriformis  fossa to gain excellent exposure of the  proximal femur. Rongeur was used to remove some cancellous bone to get  into the lateral portion of the proximal femur for placement of the  initial starter reamer. The starter broaches was placed  the starter broach  and was shown to go down the center of the canal. Broaching  with the Actis system was then performed starting at size 0  coursing  Up to size 5. A size 5 had excellent torsional and rotational  and axial stability. The trial standard offset neck was then  placed  with a 28 + 5 trial head. The hip was then reduced. We confirmed that  the stem was in the canal both on AP and lateral x-rays. It also has excellent sizing. The hip was reduced with outstanding stability through full extension and full external rotation.. AP pelvis was taken and the leg lengths were measured and found to be equal. Hip was then dislocated again and the femoral head and neck removed. The  femoral broach was removed. Size 5 Actis stem with a standard offset  neck was then impacted into the femur following native anteversion. Has  excellent purchase in the canal. Excellent torsional and rotational and  axial stability. It is confirmed to be in the canal on AP and lateral  fluoroscopic views. The 28 + 5 ceramic head was placed and the hip  reduced with outstanding stability. Again AP pelvis was taken and it  confirmed that the leg lengths were equal. The wound was then copiously  irrigated with saline solution and the capsule reattached and repaired  with Ethibond suture. 30 ml of .25% Bupivicaine was  injected into the capsule and into the edge of the tensor fascia lata as well as subcutaneous tissue. The fascia overlying the tensor fascia lata was then closed with a running #1 V-Loc. Subcu was closed with interrupted 2-0 Vicryl and subcuticular running 4-0 Monocryl. Incision was cleaned  and dried. Steri-Strips and a bulky sterile dressing applied. Hemovac  drain was hooked to suction and then the patient was awakened and transported to  recovery in stable condition.        Please note that a surgical assistant was a medical necessity for this procedure to perform it in a safe and expeditious manner. Assistant was necessary to provide appropriate retraction of vital neurovascular structures and to prevent femoral fracture and allow for anatomic placement of the prosthesis.  Gaynelle Arabian, M.D.

## 2019-12-17 NOTE — Discharge Instructions (Addendum)
Dr. Gaynelle Arabian Total Joint Specialist [facility] [address], [place], [place] [address] [phone]  ANTERIOR APPROACH TOTAL HIP REPLACEMENT POSTOPERATIVE DIRECTIONS     Hip Rehabilitation, Guidelines Following Surgery  The results of a hip operation are greatly improved after range of motion and muscle strengthening exercises. Follow all safety measures which are given to protect your hip. If any of these exercises cause increased pain or swelling in your joint, decrease the amount until you are comfortable again. Then slowly increase the exercises. Call your caregiver if you have problems or questions.   BLOOD CLOT PREVENTION . Take a 325 mg Aspirin two times a day for three weeks following surgery. Then take an 81 mg Aspirin once a day for three weeks. Then discontinue Aspirin. Debbie Lin may resume your vitamins/supplements upon discharge from the hospital. . Do not take any NSAIDs (Advil, Aleve, Ibuprofen, Meloxicam, etc.) until you have discontinued the 325 mg Aspirin.  HOME CARE INSTRUCTIONS  . Remove items at home which could result in a fall. This includes throw rugs or furniture in walking pathways.   ICE to the affected hip as frequently as 20-30 minutes an hour and then as needed for pain and swelling. Continue to use ice on the hip for pain and swelling from surgery. You may notice swelling that will progress down to the foot and ankle. This is normal after surgery. Elevate the leg when you are not up walking on it.    Continue to use the breathing machine which will help keep your temperature down.  It is common for your temperature to cycle up and down following surgery, especially at night when you are not up moving around and exerting yourself.  The breathing machine keeps your lungs expanded and your temperature down.  DIET You may resume your previous home diet once your are discharged from the hospital.  DRESSING / WOUND CARE / SHOWERING . You may  remove the adhesive bandage 2 days following surgery. Leave the tape strips across the incision in place until your first follow-up appointment. Cover the incision with a 4x4 gauze and paper tape. Change the dressing daily with new gauze and paper tape for 7-10 days following surgery. . You may begin showering 3 days following surgery, but do not submerge the incision under water. . Allow water and soap to run over the incision, pat dry, and apply a fresh gauze dressing.  ACTIVITY . For the first 3-5 days, it is important to rest and keep the operative leg elevated. You should, as a general rule, rest for 50 minutes and walk/stretch for 10 minutes per hour. After 5 days, you may slowly increase activity as tolerated.  Marland Kitchen Perform the exercises you were provided twice a day for about 15-20 minutes each session. Begin these 2 days following surgery. . Walk with your walker as instructed. Use the walker until you are comfortable transitioning to a cane. Walk with the cane in the opposite hand of the operative leg. You may discontinue the cane once you are comfortable and walking steadily. . Avoid periods of inactivity such as sitting longer than an hour when not asleep. This helps prevent blood clots.  . Do not drive a car for 6 weeks or until released by your surgeon.  . Do not drive while taking narcotics.  TED HOSE STOCKINGS Wear the elastic stockings on both legs for three weeks following surgery during the day. You may remove them at night while sleeping.  WEIGHT BEARING Weight bearing as tolerated with assist device (walker, cane, etc) as directed, use it as long as suggested by your surgeon or therapist, typically at least 4-6 weeks.  POSTOPERATIVE CONSTIPATION PROTOCOL Constipation - defined medically as fewer than three stools per week and severe constipation as less than one stool per week.  One of the most common issues patients have following surgery is constipation.  Even if you have a  regular bowel pattern at home, your normal regimen is likely to be disrupted due to multiple reasons following surgery.  Combination of anesthesia, postoperative narcotics, change in appetite and fluid intake all can affect your bowels.  In order to avoid complications following surgery, here are some recommendations in order to help you during your recovery period.  . Colace (docusate) - Pick up an over-the-counter form of Colace or another stool softener and take twice a day as long as you are requiring postoperative pain medications.  Take with a full glass of water daily.  If you experience loose stools or diarrhea, hold the colace until you stool forms back up.  If your symptoms do not get better within 1 week or if they get worse, check with your doctor. . Dulcolax (bisacodyl) - Pick up over-the-counter and take as directed by the product packaging as needed to assist with the movement of your bowels.  Take with a full glass of water.  Use this product as needed if not relieved by Colace only.  . MiraLax (polyethylene glycol) - Pick up over-the-counter to have on hand.  MiraLax is a solution that will increase the amount of water in your bowels to assist with bowel movements.  Take as directed and can mix with a glass of water, juice, soda, coffee, or tea.  Take if you go more than two days without a movement.Do not use MiraLax more than once per day. Call your doctor if you are still constipated or irregular after using this medication for 7 days in a row.  If you continue to have problems with postoperative constipation, please contact the office for further assistance and recommendations.  If you experience "the worst abdominal pain ever" or develop nausea or vomiting, please contact the office immediatly for further recommendations for treatment.  ITCHING  If you experience itching with your medications, try taking only a single pain pill, or even half a pain pill at a time.  You can also use  Benadryl over the counter for itching or also to help with sleep.   MEDICATIONS See your medication summary on the "After Visit Summary" that the nursing staff will review with you prior to discharge.  You may have some home medications which will be placed on hold until you complete the course of blood thinner medication.  It is important for you to complete the blood thinner medication as prescribed by your surgeon.  Continue your approved medications as instructed at time of discharge.  PRECAUTIONS If you experience chest pain or shortness of breath - call 911 immediately for transfer to the hospital emergency department.  If you develop a fever greater that 101 F, purulent drainage from wound, increased redness or drainage from wound, foul odor from the wound/dressing, or calf pain - CONTACT YOUR SURGEON.  FOLLOW-UP APPOINTMENTS Make sure you keep all of your appointments after your operation with your surgeon and caregivers. You should call the office at the above phone number and make an appointment for approximately two weeks after the date of your surgery or on the date instructed by your surgeon outlined in the "After Visit Summary".  RANGE OF MOTION AND STRENGTHENING EXERCISES  These exercises are designed to help you keep full movement of your hip joint. Follow your caregiver's or physical therapist's instructions. Perform all exercises about fifteen times, three times per day or as directed. Exercise both hips, even if you have had only one joint replacement. These exercises can be done on a training (exercise) mat, on the floor, on a table or on a bed. Use whatever works the best and is most comfortable for you. Use music or television while you are exercising so that the exercises are a pleasant break in your day. This will make your life better with the exercises acting as a break in routine you can look forward to.  . Lying on your back,  slowly slide your foot toward your buttocks, raising your knee up off the floor. Then slowly slide your foot back down until your leg is straight again.  . Lying on your back spread your legs as far apart as you can without causing discomfort.  . Lying on your side, raise your upper leg and foot straight up from the floor as far as is comfortable. Slowly lower the leg and repeat.  . Lying on your back, tighten up the muscle in the front of your thigh (quadriceps muscles). You can do this by keeping your leg straight and trying to raise your heel off the floor. This helps strengthen the largest muscle supporting your knee.  . Lying on your back, tighten up the muscles of your buttocks both with the legs straight and with the knee bent at a comfortable angle while keeping your heel on the floor.   IF YOU ARE TRANSFERRED TO A SKILLED REHAB FACILITY If the patient is transferred to a skilled rehab facility following release from the hospital, a list of the current medications will be sent to the facility for the patient to continue.  When discharged from the skilled rehab facility, please have the facility set up the patient's [place] prior to being released. Also, the skilled facility will be responsible for providing the patient with their medications at time of release from the facility to include their pain medication, the muscle relaxants, and their blood thinner medication. If the patient is still at the rehab facility at time of the two week follow up appointment, the skilled rehab facility will also need to assist the patient in arranging follow up appointment in our office and any transportation needs.  MAKE SURE YOU:  . Understand these instructions.  . Get help right away if you are not doing well or get worse.    Pick up stool softner and laxative for home use following surgery while on pain medications. Do not submerge incision under water. Please use good hand washing  techniques while changing dressing each day. May shower starting three days after surgery. Please use a clean towel to pat the incision dry following showers. Continue to use ice for pain and swelling after surgery. Do not use any lotions or creams on the incision until instructed by your surgeon.

## 2019-12-17 NOTE — Anesthesia Procedure Notes (Addendum)
Spinal  Patient location during procedure: OR Start time: 12/17/2019 12:11 PM End time: 12/17/2019 12:23 PM Staffing Performed: resident/CRNA  Resident/CRNA: Silas Sacramento, CRNA Preanesthetic Checklist Completed: patient identified, IV checked, site marked, risks and benefits discussed, surgical consent, monitors and equipment checked, pre-op evaluation and timeout performed Spinal Block Patient position: sitting Prep: DuraPrep Patient monitoring: heart rate, cardiac monitor, continuous pulse ox and blood pressure Approach: midline Location: L3-4 Injection technique: single-shot Needle Needle type: Sprotte  Needle gauge: 24 G Needle length: 9 cm Assessment Sensory level: T4 Additional Notes IV functioning, monitors applied to pt. Expiration date of kit checked and confirmed to be in date. Sterile prep and drape, hand hygiene and sterile gloved used. Pt was positioned and spine was prepped in sterile fashion. Skin was anesthetized with lidocaine. Free flow of clear CSF obtained prior to injecting local anesthetic into CSF x 1 attempt. Spinal needle aspirated freely following injection. Needle was carefully withdrawn, and pt tolerated procedure well. Loss of motor and sensory on exam post injection.

## 2019-12-17 NOTE — Evaluation (Signed)
Physical Therapy Evaluation Patient Details Name: Debbie Lin MRN: [id number] DOB: 1956/07/07 Today's Date: 12/17/2019   History of Present Illness  Pt is 64 yo female s/p anterior THA on right on 12/17/19.  See H and P for full PMH.  Clinical Impression  Pt is s/p THA resulting in the deficits listed below (see PT Problem List). Pt was limited due to orthostatic hypotension, but prior to syncopal symptoms performed transfers with min guard and ambulated 50'.  Pt with good pain control.  Pt with excellent rehab potential. Pt will benefit from skilled PT to increase their independence and safety with mobility to allow discharge to the venue listed below.      Follow Up Recommendations Follow surgeon's recommendation for DC plan and follow-up therapies    Equipment Recommendations  None recommended by PT(has RW)    Recommendations for Other Services       Precautions / Restrictions Precautions Precautions: None Restrictions Weight Bearing Restrictions: Yes RLE Weight Bearing: Weight bearing as tolerated      Mobility  Bed Mobility Overal bed mobility: Needs Assistance Bed Mobility: Supine to Sit     Supine to sit: Min guard     General bed mobility comments: cues for techniqe  Transfers Overall transfer level: Needs assistance Equipment used: Rolling walker (2 wheeled) Transfers: Sit to/from Stand Sit to Stand: Min guard         General transfer comment: cues for safe hand placement  Ambulation/Gait Ambulation/Gait assistance: Min guard Gait Distance (Feet): 60 Feet Assistive device: Rolling walker (2 wheeled) Gait Pattern/deviations: Step-to pattern     General Gait Details: cued for RW and sequence; limited by dizziness and hypotension  Stairs            Wheelchair Mobility    Modified Rankin (Stroke Patients Only)       Balance Overall balance assessment: Needs assistance Sitting-balance support: Bilateral upper extremity supported;Feet  supported Sitting balance-Leahy Scale: Good     Standing balance support: Bilateral upper extremity supported;During functional activity Standing balance-Leahy Scale: Fair                               Pertinent Vitals/Pain Pain Assessment: 0-10 Pain Score: 2  Pain Location: R hip Pain Intervention(s): Limited activity within patient's tolerance;Monitored during session;Premedicated before session    Home Living Family/patient expects to be discharged to:: Private residence Living Arrangements: Spouse/significant other;Children;Other relatives Available Help at Discharge: Family;Available 24 hours/day Type of Home: House Home Access: Stairs to enter Entrance Stairs-Rails: Left Entrance Stairs-Number of Steps: 2 Home Layout: (Does have 1 step in house without rail) Home Equipment: Walker - 2 wheels;Cane - single point;Shower seat;Grab bars - tub/shower;Other (comment)(sink bedside toilet)      Prior Function Level of Independence: Independent         Comments: Works - office work     Journalist, newspaper        Extremity/Trunk Assessment   Upper Extremity Assessment Upper Extremity Assessment: Overall WFL for tasks assessed    Lower Extremity Assessment Lower Extremity Assessment: RLE deficits/detail RLE Deficits / Details: ROM WFL; MMT ankle 5/5, hip and knee 3/5 not further tested LLE Deficits / Details: WNL       Communication   Communication: No difficulties  Cognition Arousal/Alertness: Awake/alert Behavior During Therapy: WFL for tasks assessed/performed Overall Cognitive Status: Within Functional Limits for tasks assessed  General Comments General comments (skin integrity, edema, etc.): Pt on 2 LPM at arrival with sats 98%; tried RA and pt maintained 96%; did placed back on 2 LPM post therapy.  Pt became dizzy and diaphoretic with walking, returned to sitting and BP was 73/46, pt reclined  and BP up to 92/68 and pt feeling better. RN present and aware.    Exercises Total Joint Exercises Ankle Circles/Pumps: AROM;Both;10 reps;Supine Quad Sets: AROM;Both;10 reps;Supine Heel Slides: AAROM;Right;5 reps;Supine Hip ABduction/ADduction: AAROM;Right;5 reps;Supine   Assessment/Plan    PT Assessment Patient needs continued PT services  PT Problem List Decreased strength;Decreased mobility;Decreased range of motion;Decreased activity tolerance;Cardiopulmonary status limiting activity;Decreased balance;Decreased knowledge of use of DME       PT Treatment Interventions DME instruction;Therapeutic activities;Modalities;Gait training;Therapeutic exercise;Patient/family education;Stair training;Balance training;Functional mobility training    PT Goals (Current goals can be found in the Care Plan section)  Acute Rehab PT Goals Patient Stated Goal: return home PT Goal Formulation: With patient Time For Goal Achievement: 01/01/20 Potential to Achieve Goals: Good    Frequency 7X/week   Barriers to discharge        Co-evaluation               AM-PAC PT "6 Clicks" Mobility  Outcome Measure Help needed turning from your back to your side while in a flat bed without using bedrails?: None Help needed moving from lying on your back to sitting on the side of a flat bed without using bedrails?: None Help needed moving to and from a bed to a chair (including a wheelchair)?: None Help needed standing up from a chair using your arms (e.g., wheelchair or bedside chair)?: None Help needed to walk in hospital room?: None Help needed climbing 3-5 steps with a railing? : A Little 6 Click Score: 23    End of Session Equipment Utilized During Treatment: Gait belt Activity Tolerance: Other (comment)(limited by orthostatic hypotension.) Patient left: in chair;with chair alarm set;with call bell/phone within reach(reclined) Nurse Communication: Mobility status PT Visit Diagnosis: Muscle  weakness (generalized) (M62.81);Other abnormalities of gait and mobility (R26.89)    Time: [id number] PT Time Calculation (min) (ACUTE ONLY): 30 min   Charges:   PT Evaluation $PT Eval Moderate Complexity: 1 Mod PT Treatments $Therapeutic Exercise: 8-22 mins        Maggie Font, PT Acute Rehab Services Pager [phone] [facility] [phone] [facility] [phone]   Karlton Lemon 12/17/2019, 5:13 PM

## 2019-12-18 ENCOUNTER — Other Ambulatory Visit: Payer: Self-pay

## 2019-12-18 ENCOUNTER — Encounter: Payer: Self-pay | Admitting: *Deleted

## 2019-12-18 DIAGNOSIS — M1611 Unilateral primary osteoarthritis, right hip: Secondary | ICD-10-CM | POA: Diagnosis not present

## 2019-12-18 LAB — CBC
HCT: 29.1 % — ABNORMAL LOW (ref 36.0–46.0)
Hemoglobin: 9.3 g/dL — ABNORMAL LOW (ref 12.0–15.0)
MCH: 28.8 pg (ref 26.0–34.0)
MCHC: 32 g/dL (ref 30.0–36.0)
MCV: 90.1 fL (ref 80.0–100.0)
Platelets: 181 10*3/uL (ref 150–400)
RBC: 3.23 MIL/uL — ABNORMAL LOW (ref 3.87–5.11)
RDW: 12.9 % (ref 11.5–15.5)
WBC: 9.7 10*3/uL (ref 4.0–10.5)
nRBC: 0 % (ref 0.0–0.2)

## 2019-12-18 LAB — BASIC METABOLIC PANEL
Anion gap: 8 (ref 5–15)
BUN: 11 mg/dL (ref 8–23)
CO2: 25 mmol/L (ref 22–32)
Calcium: 8.5 mg/dL — ABNORMAL LOW (ref 8.9–10.3)
Chloride: 104 mmol/L (ref 98–111)
Creatinine, Ser: 0.77 mg/dL (ref 0.44–1.00)
GFR calc Af Amer: >60 mL/min (ref >60)
GFR calc non Af Amer: >60 mL/min (ref >60)
Glucose, Bld: 139 mg/dL — ABNORMAL HIGH (ref 70–99)
Potassium: 4.7 mmol/L (ref 3.5–5.1)
Sodium: 137 mmol/L (ref 135–145)

## 2019-12-18 MED ORDER — ASPIRIN 325 MG PO TBEC: 325.0000 mg | DELAYED_RELEASE_TABLET | Freq: Two times a day (BID) | ORAL | 0 refills | Status: AC

## 2019-12-18 MED ORDER — HYDROCODONE-ACETAMINOPHEN 5-325 MG PO TABS: 1.0000 | ORAL_TABLET | ORAL | 0 refills | Status: AC | PRN

## 2019-12-18 MED ORDER — METHOCARBAMOL 500 MG PO TABS: 500.0000 mg | ORAL_TABLET | Freq: Four times a day (QID) | ORAL | 0 refills | Status: AC | PRN

## 2019-12-18 NOTE — Plan of Care (Signed)
Patient discharged home in stable condition 

## 2019-12-18 NOTE — Progress Notes (Signed)
Physical Therapy Treatment Patient Details Name: Debbie Lin MRN: [id number] DOB: January 28, 1956 Today's Date: 12/18/2019    History of Present Illness Pt is 64 yo female s/p anterior THA on right on 12/17/19.  See H and P for full PMH.    PT Comments    Pt progressing well with mobility.  Pt reviewed car transfers, stairs and written HEP and is eager for dc home.   Follow Up Recommendations  Follow surgeon's recommendation for DC plan and follow-up therapies     Equipment Recommendations  None recommended by PT    Recommendations for Other Services       Precautions / Restrictions Precautions Precautions: None Restrictions Weight Bearing Restrictions: No RLE Weight Bearing: Weight bearing as tolerated    Mobility  Bed Mobility Overal bed mobility: Needs Assistance Bed Mobility: Supine to Sit     Supine to sit: Min guard     General bed mobility comments: cues for techniqe  Transfers Overall transfer level: Needs assistance Equipment used: Rolling walker (2 wheeled) Transfers: Sit to/from Stand Sit to Stand: Supervision         General transfer comment: cues for safe hand placement  Ambulation/Gait Ambulation/Gait assistance: Min guard;Supervision Gait Distance (Feet): 120 Feet Assistive device: Rolling walker (2 wheeled) Gait Pattern/deviations: Step-to pattern;Step-through pattern;Shuffle;Trunk flexed Gait velocity: decr   General Gait Details: cues for posture, position from RW, ER on R and initial sequence; no c/o dizziness    Stairs Stairs: Yes Stairs assistance: Min assist;Min guard Stair Management: One rail Left;Step to pattern;Forwards;With cane Number of Stairs: 2 General stair comments: cues for sequence and foot/cane placement   Wheelchair Mobility    Modified Rankin (Stroke Patients Only)       Balance Overall balance assessment: Needs assistance Sitting-balance support: Bilateral upper extremity supported;Feet  supported Sitting balance-Leahy Scale: Good     Standing balance support: Bilateral upper extremity supported;During functional activity Standing balance-Leahy Scale: Fair                              Cognition Arousal/Alertness: Awake/alert Behavior During Therapy: WFL for tasks assessed/performed Overall Cognitive Status: Within Functional Limits for tasks assessed                                        Exercises Total Joint Exercises Ankle Circles/Pumps: AROM;Both;Supine;20 reps Quad Sets: AROM;Both;10 reps;Supine Heel Slides: AAROM;Right;Supine;20 reps Hip ABduction/ADduction: AAROM;Right;Supine;15 reps Long Arc Quad: AROM;Right;10 reps;Supine;Seated;Other reps (comment)    General Comments        Pertinent Vitals/Pain Pain Assessment: 0-10 Pain Score: 4  Pain Location: R hip Pain Descriptors / Indicators: Aching;Sore Pain Intervention(s): Limited activity within patient's tolerance;Monitored during session;Premedicated before session    Home Living                      Prior Function            PT Goals (current goals can now be found in the care plan section) Acute Rehab PT Goals Patient Stated Goal: return home PT Goal Formulation: With patient Time For Goal Achievement: 01/01/20 Potential to Achieve Goals: Good Progress towards PT goals: Progressing toward goals    Frequency    7X/week      PT Plan Current plan remains appropriate    Co-evaluation  AM-PAC PT "6 Clicks" Mobility   Outcome Measure  Help needed turning from your back to your side while in a flat bed without using bedrails?: A Little Help needed moving from lying on your back to sitting on the side of a flat bed without using bedrails?: A Little Help needed moving to and from a bed to a chair (including a wheelchair)?: A Little Help needed standing up from a chair using your arms (e.g., wheelchair or bedside chair)?: A  Little Help needed to walk in hospital room?: A Little Help needed climbing 3-5 steps with a railing? : A Little 6 Click Score: 18    End of Session Equipment Utilized During Treatment: Gait belt Activity Tolerance: Patient tolerated treatment well Patient left: in chair;with chair alarm set;with call bell/phone within reach Nurse Communication: Mobility status PT Visit Diagnosis: Muscle weakness (generalized) (M62.81);Other abnormalities of gait and mobility (R26.89)     Time: [id number] PT Time Calculation (min) (ACUTE ONLY): 21 min  Charges:  $Gait Training: 8-22 mins $Therapeutic Exercise: 8-22 mins                     Debe Coder PT Acute Rehabilitation Services Pager [phone] Office [phone]    Caelan Atchley 12/18/2019, 12:53 PM

## 2019-12-18 NOTE — Progress Notes (Signed)
   Subjective: 1 Day Post-Op Procedure(s) (LRB): TOTAL HIP ARTHROPLASTY ANTERIOR APPROACH (Right) Patient reports pain as mild.   Patient seen in rounds with Dr. Wynelle Link. Patient is well, and has had no acute complaints or problems, states she is ready to go home. Denies chest pain or SOB. Foley catheter to be removed this AM. No issues overnight.  We will continue therapy today, ambulated 44' yesterday.  Objective: Vital signs in last 24 hours: Temp:  [97.6 F (36.4 C)-98.6 F (37 C)] 98.6 F (37 C) (01/21 0551) Pulse Rate:  [73-102] 91 (01/21 0551) Resp:  [12-23] 18 (01/21 0551) BP: (102-145)/(68-90) 102/71 (01/21 0551) SpO2:  [98 %-100 %] 99 % (01/21 0551) Weight:  [84.4 kg] 84.4 kg (01/20 1126)  Intake/Output from previous day:  Intake/Output Summary (Last 24 hours) at 12/18/2019 0731 Last data filed at 12/18/2019 0600 Gross per 24 hour  Intake 3955.72 ml  Output 3655 ml  Net 300.72 ml   Labs: Recent Labs    12/15/19 1356 12/18/19 0432  HGB 13.4 9.3*   Recent Labs    12/15/19 1356 12/18/19 0432  WBC 7.4 9.7  RBC 4.65 3.23*  HCT 42.0 29.1*  PLT 264 181   Recent Labs    12/15/19 1356 12/18/19 0432  NA 139 137  K 4.5 4.7  CL 102 104  CO2 28 25  BUN 16 11  CREATININE 0.80 0.77  GLUCOSE 97 139*  CALCIUM 9.4 8.5*   Recent Labs    12/15/19 1356  INR 1.0    Exam: General - Patient is Alert and Oriented Extremity - Neurologically intact Neurovascular intact Sensation intact distally Dorsiflexion/Plantar flexion intact Dressing - dressing C/D/I Motor Function - intact, moving foot and toes well on exam.   Past Medical History:  Diagnosis Date  . Anxiety   . Basal cell carcinoma   . Depression   . History of uterine prolapse   . Hypothyroidism   . Migraines    Occ  . OA (osteoarthritis)     Assessment/Plan: 1 Day Post-Op Procedure(s) (LRB): TOTAL HIP ARTHROPLASTY ANTERIOR APPROACH (Right) Principal Problem:   OA (osteoarthritis) of  hip Active Problems:   S/P total hip arthroplasty  Estimated body mass index is 29.14 kg/m as calculated from the following:   Height as of this encounter: 5\' 7"  (1.702 m).   Weight as of this encounter: 84.4 kg. Advance diet Up with therapy D/C IV fluids  DVT Prophylaxis - Aspirin Weight bearing as tolerated. D/C O2 and pulse ox and try on room air. Hemovac pulled without difficulty, will continue therapy.  Hemoglobin 9.3 this AM. Intraoperative blood loss was 1049mL.  Plan is to go Home after hospital stay. Plan for discharge today with HEP once progresses with therapy and is meeting her goals. Follow-up in the office in 2 weeks.   Theresa Duty, PA-C Orthopedic Surgery 12/18/2019, 7:31 AM

## 2019-12-18 NOTE — Progress Notes (Signed)
Physical Therapy Treatment Patient Details Name: Debbie Lin MRN: [id number] DOB: 23-Dec-1955 Today's Date: 12/18/2019    History of Present Illness Pt is 64 yo female s/p anterior THA on right on 12/17/19.  See H and P for full PMH.    PT Comments    Pt motivated and progressing well with mobility - no c/o dizziness this am.   Follow Up Recommendations  Follow surgeon's recommendation for DC plan and follow-up therapies     Equipment Recommendations  None recommended by PT    Recommendations for Other Services       Precautions / Restrictions Precautions Precautions: None Restrictions Weight Bearing Restrictions: No RLE Weight Bearing: Weight bearing as tolerated    Mobility  Bed Mobility Overal bed mobility: Needs Assistance Bed Mobility: Supine to Sit     Supine to sit: Min guard     General bed mobility comments: cues for techniqe  Transfers Overall transfer level: Needs assistance Equipment used: Rolling walker (2 wheeled) Transfers: Sit to/from Stand Sit to Stand: Min guard;Supervision         General transfer comment: cues for safe hand placement  Ambulation/Gait Ambulation/Gait assistance: Min guard Gait Distance (Feet): 150 Feet Assistive device: Rolling walker (2 wheeled) Gait Pattern/deviations: Step-to pattern;Step-through pattern;Shuffle;Trunk flexed Gait velocity: decr   General Gait Details: cues for posture, position from RW, ER on R and initial sequence; no c/o dizziness    Stairs             Wheelchair Mobility    Modified Rankin (Stroke Patients Only)       Balance Overall balance assessment: Needs assistance Sitting-balance support: Bilateral upper extremity supported;Feet supported Sitting balance-Leahy Scale: Good     Standing balance support: Bilateral upper extremity supported;During functional activity Standing balance-Leahy Scale: Fair                              Cognition  Arousal/Alertness: Awake/alert Behavior During Therapy: WFL for tasks assessed/performed Overall Cognitive Status: Within Functional Limits for tasks assessed                                        Exercises Total Joint Exercises Ankle Circles/Pumps: AROM;Both;Supine;20 reps Quad Sets: AROM;Both;10 reps;Supine Heel Slides: AAROM;Right;Supine;20 reps Hip ABduction/ADduction: AAROM;Right;Supine;15 reps Long Arc Quad: AROM;Right;10 reps;Supine;Seated;Other reps (comment)    General Comments        Pertinent Vitals/Pain Pain Assessment: 0-10 Pain Score: 3  Pain Location: R hip Pain Descriptors / Indicators: Aching;Sore Pain Intervention(s): Limited activity within patient's tolerance;Monitored during session;Premedicated before session;Ice applied    Home Living                      Prior Function            PT Goals (current goals can now be found in the care plan section) Acute Rehab PT Goals Patient Stated Goal: return home PT Goal Formulation: With patient Time For Goal Achievement: 01/01/20 Potential to Achieve Goals: Good Progress towards PT goals: Progressing toward goals    Frequency    7X/week      PT Plan Current plan remains appropriate    Co-evaluation              AM-PAC PT "6 Clicks" Mobility   Outcome Measure  Help needed turning from your back  to your side while in a flat bed without using bedrails?: A Little Help needed moving from lying on your back to sitting on the side of a flat bed without using bedrails?: A Little Help needed moving to and from a bed to a chair (including a wheelchair)?: A Little Help needed standing up from a chair using your arms (e.g., wheelchair or bedside chair)?: A Little Help needed to walk in hospital room?: A Little Help needed climbing 3-5 steps with a railing? : A Little 6 Click Score: 18    End of Session Equipment Utilized During Treatment: Gait belt Activity Tolerance:  Patient tolerated treatment well Patient left: in chair;with chair alarm set;with call bell/phone within reach Nurse Communication: Mobility status PT Visit Diagnosis: Muscle weakness (generalized) (M62.81);Other abnormalities of gait and mobility (R26.89)     Time: [id number] PT Time Calculation (min) (ACUTE ONLY): 31 min  Charges:  $Gait Training: 8-22 mins $Therapeutic Exercise: 8-22 mins                     Debbie Lin PT Acute Rehabilitation Services Pager [phone] Office [phone]    Debbie Lin 12/18/2019, 10:07 AM

## 2021-01-19 IMAGING — RF DG HIP (WITH PELVIS) OPERATIVE*R*
1 series · 4 of 4 positions shown · non-contrast
Comparison: None.

CLINICAL DATA: Post right hip arthroplasty.

EXAM:
OPERATIVE right HIP (WITH PELVIS IF PERFORMED) 4 VIEWS
TECHNIQUE: Fluoroscopic spot image(s) were submitted for interpretation
post-operatively.

[Series 1: unknown protocol · 0.20mm/px · 4 of 4 slices shown]
[im 1/4]
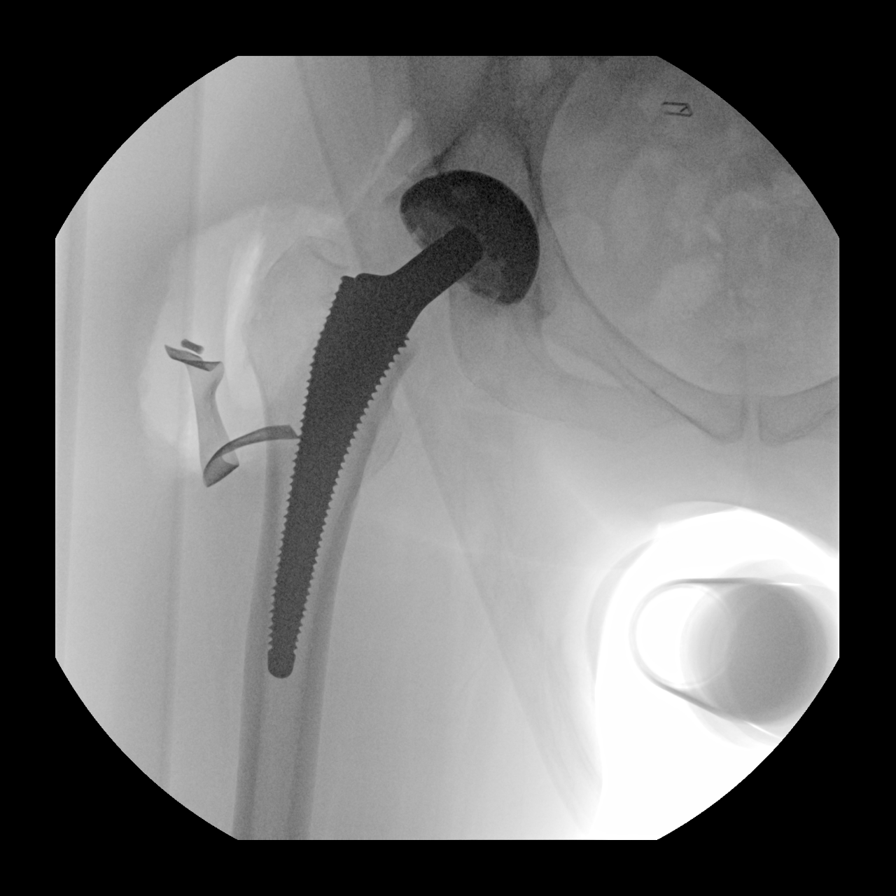
[im 2/4]
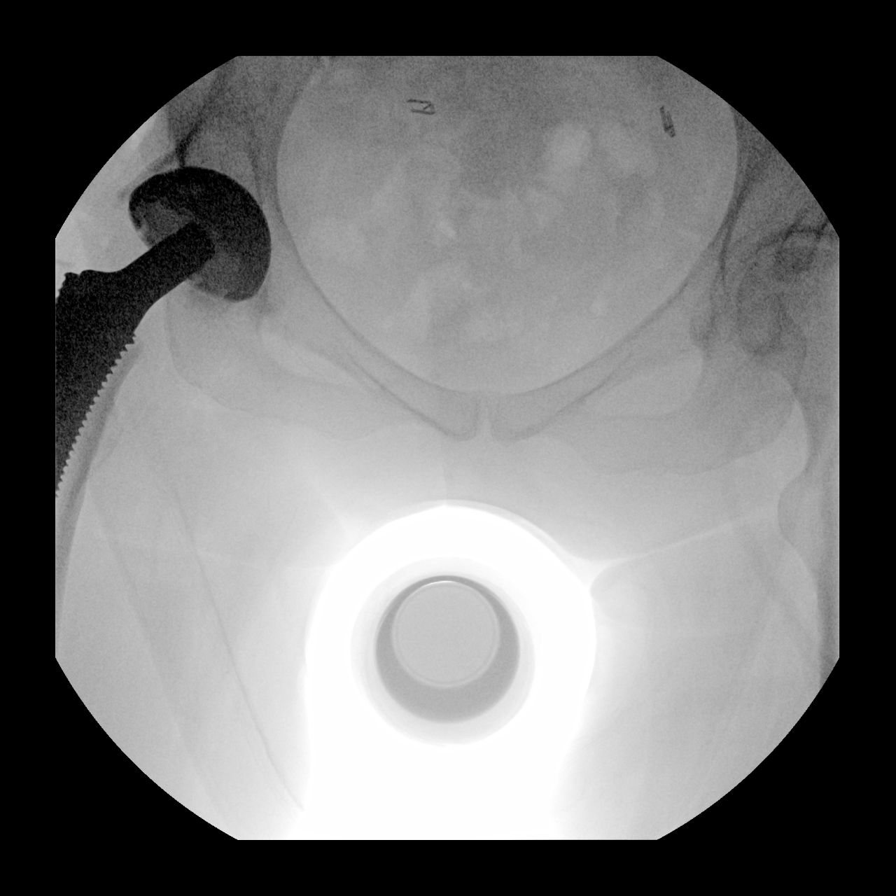
[im 3/4]
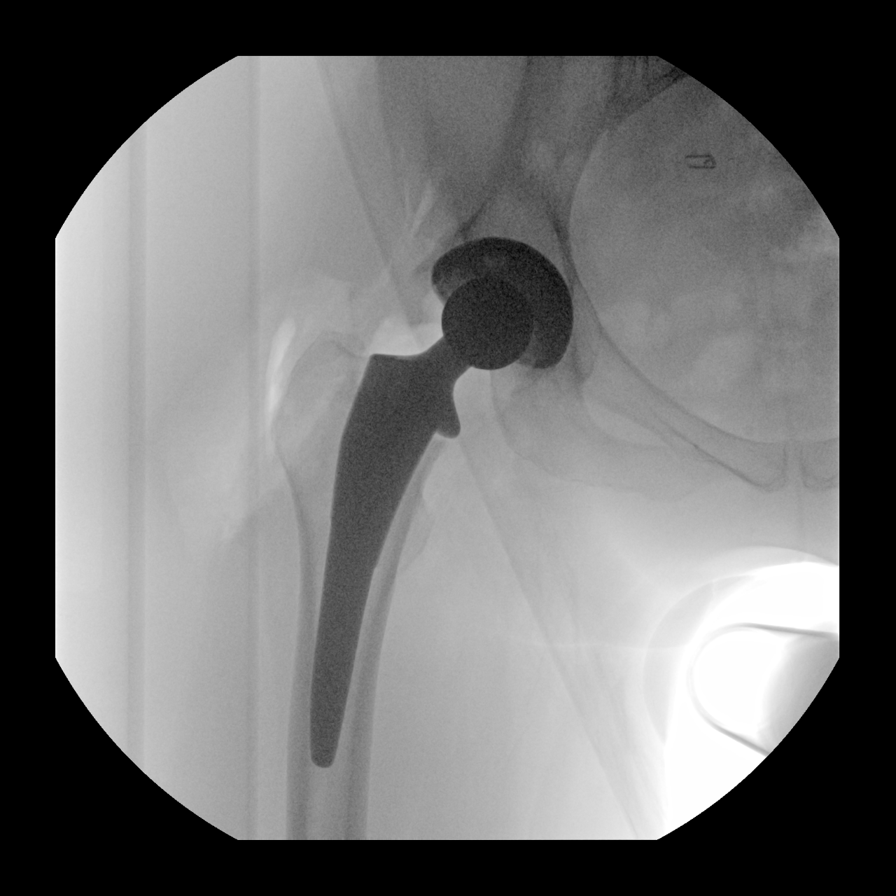
[im 4/4]
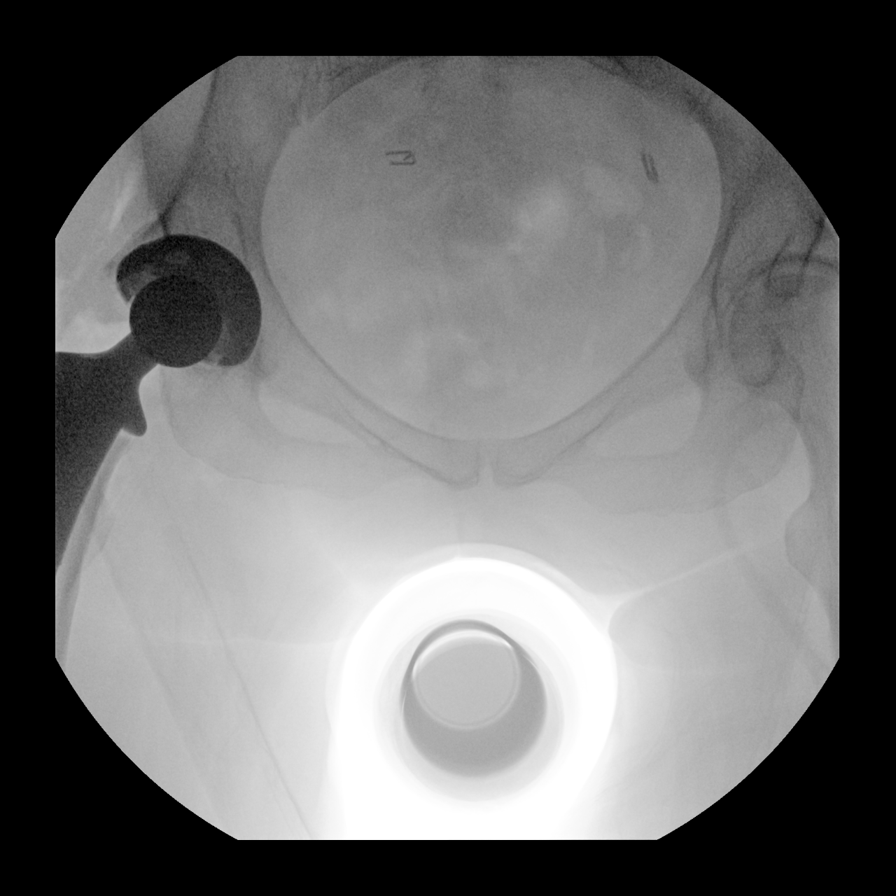

[4 of 4 positions shown; findings below may reference images not displayed]

FINDINGS: Examination demonstrates evidence of patient's recent right total
hip arthroplasty which is intact and normally located. Recommend
correlation with findings at the time of the procedure.
IMPRESSION: Expected changes post right total hip arthroplasty.

## 2021-01-19 IMAGING — RF DG C-ARM 1-60 MIN-NO REPORT
1 series · 4 of 4 positions shown · non-contrast
Comparison: None.

CLINICAL DATA: Post right hip arthroplasty.

EXAM:
OPERATIVE right HIP (WITH PELVIS IF PERFORMED) 4 VIEWS
TECHNIQUE: Fluoroscopic spot image(s) were submitted for interpretation
post-operatively.

[Series 1: unknown protocol · 0.20mm/px · 4 of 4 slices shown]
[im 1/4]
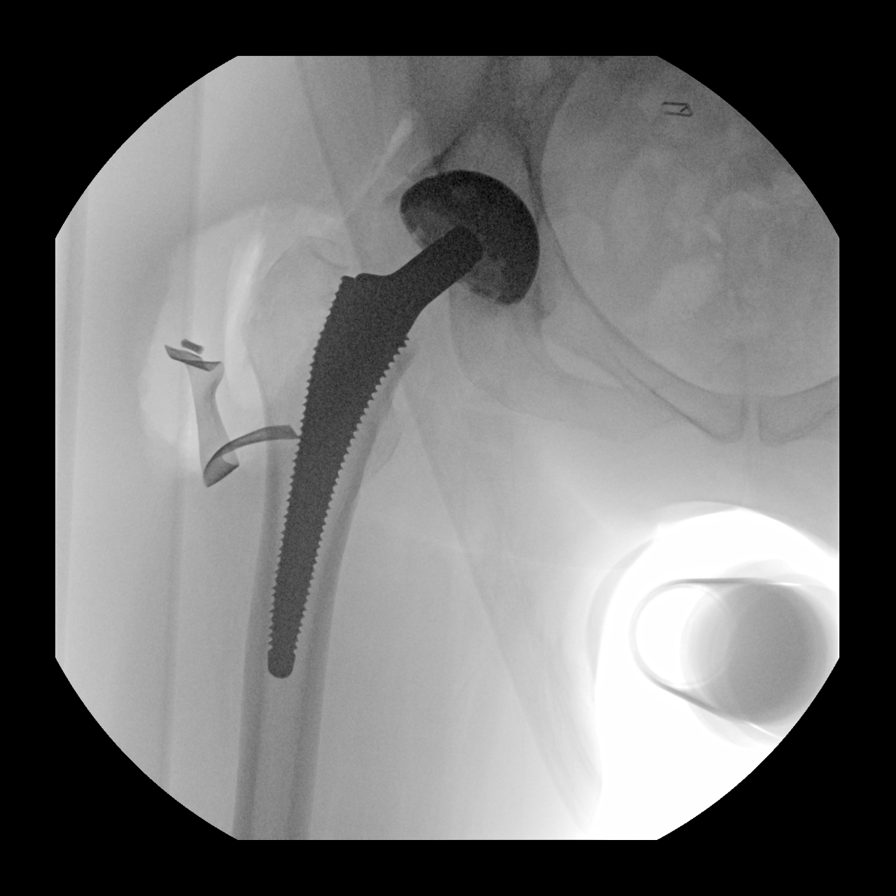
[im 2/4]
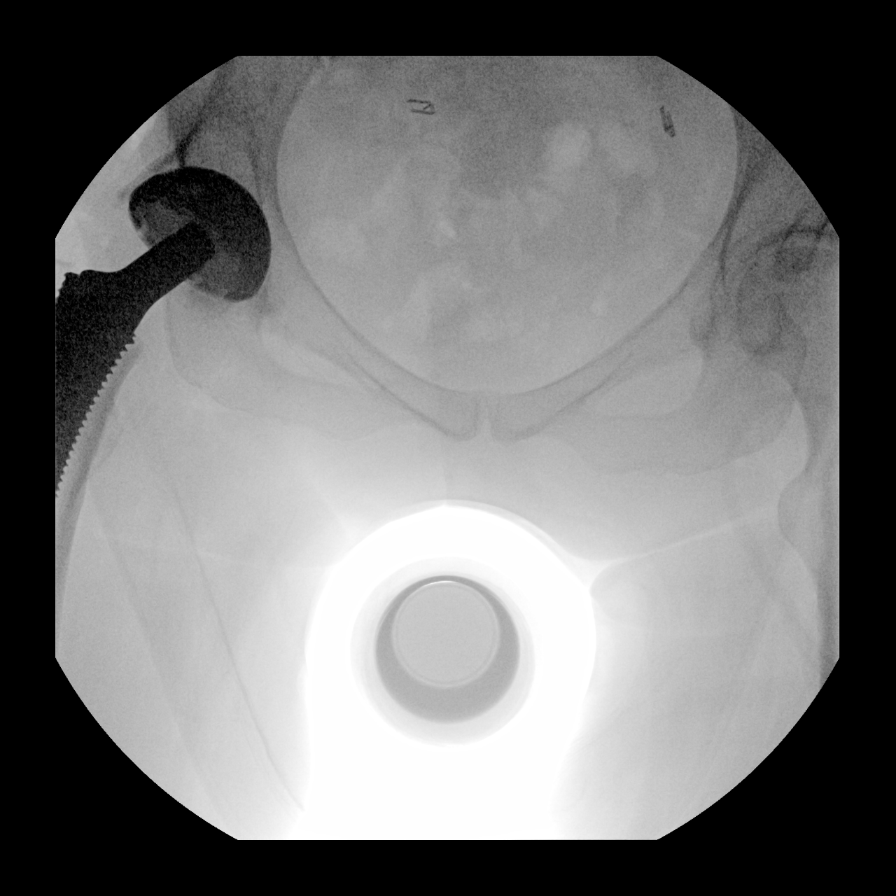
[im 3/4]
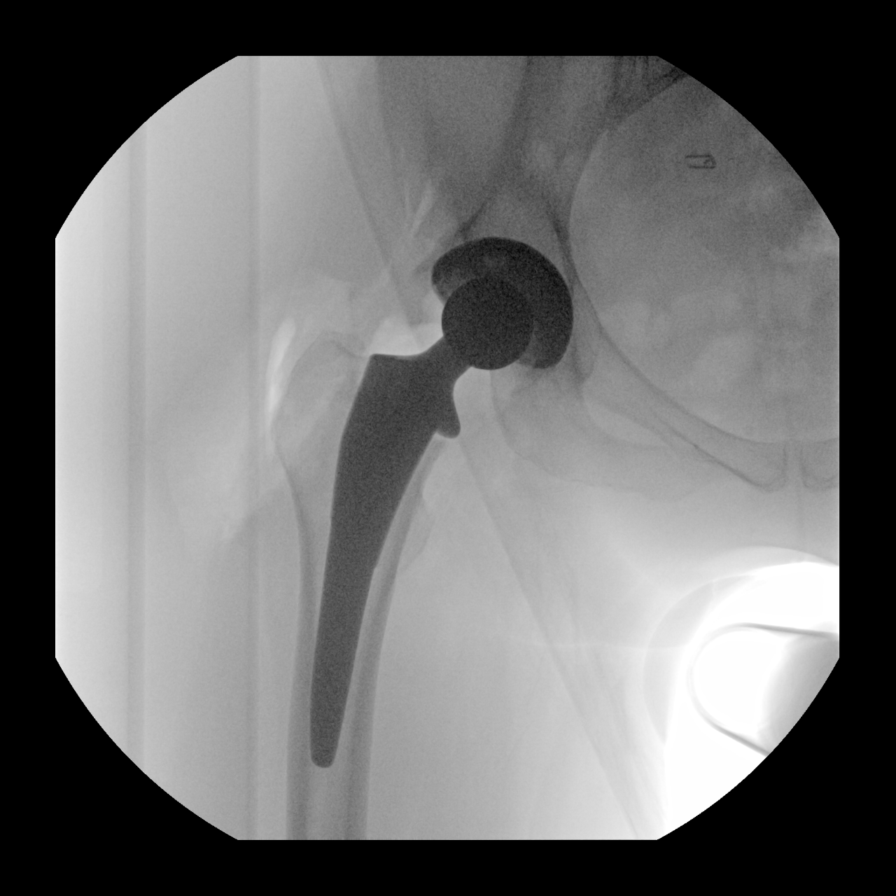
[im 4/4]
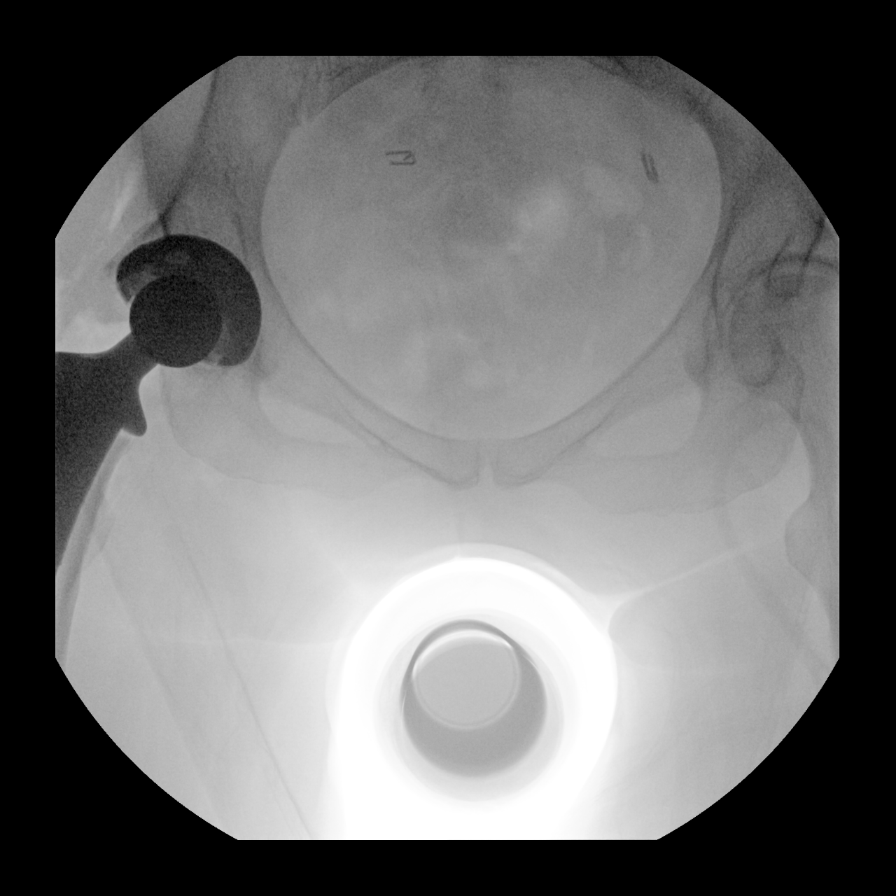

[4 of 4 positions shown; findings below may reference images not displayed]

FINDINGS: Examination demonstrates evidence of patient's recent right total
hip arthroplasty which is intact and normally located. Recommend
correlation with findings at the time of the procedure.
IMPRESSION: Expected changes post right total hip arthroplasty.

## 2021-01-19 IMAGING — DX DG PORTABLE PELVIS
1 series · 1 of 1 positions shown · non-contrast
Comparison: Intraoperative films from earlier in the same day.

CLINICAL DATA: Status post right hip replacement

EXAM:
PORTABLE PELVIS 1-2 VIEWS

[pelvis ap]
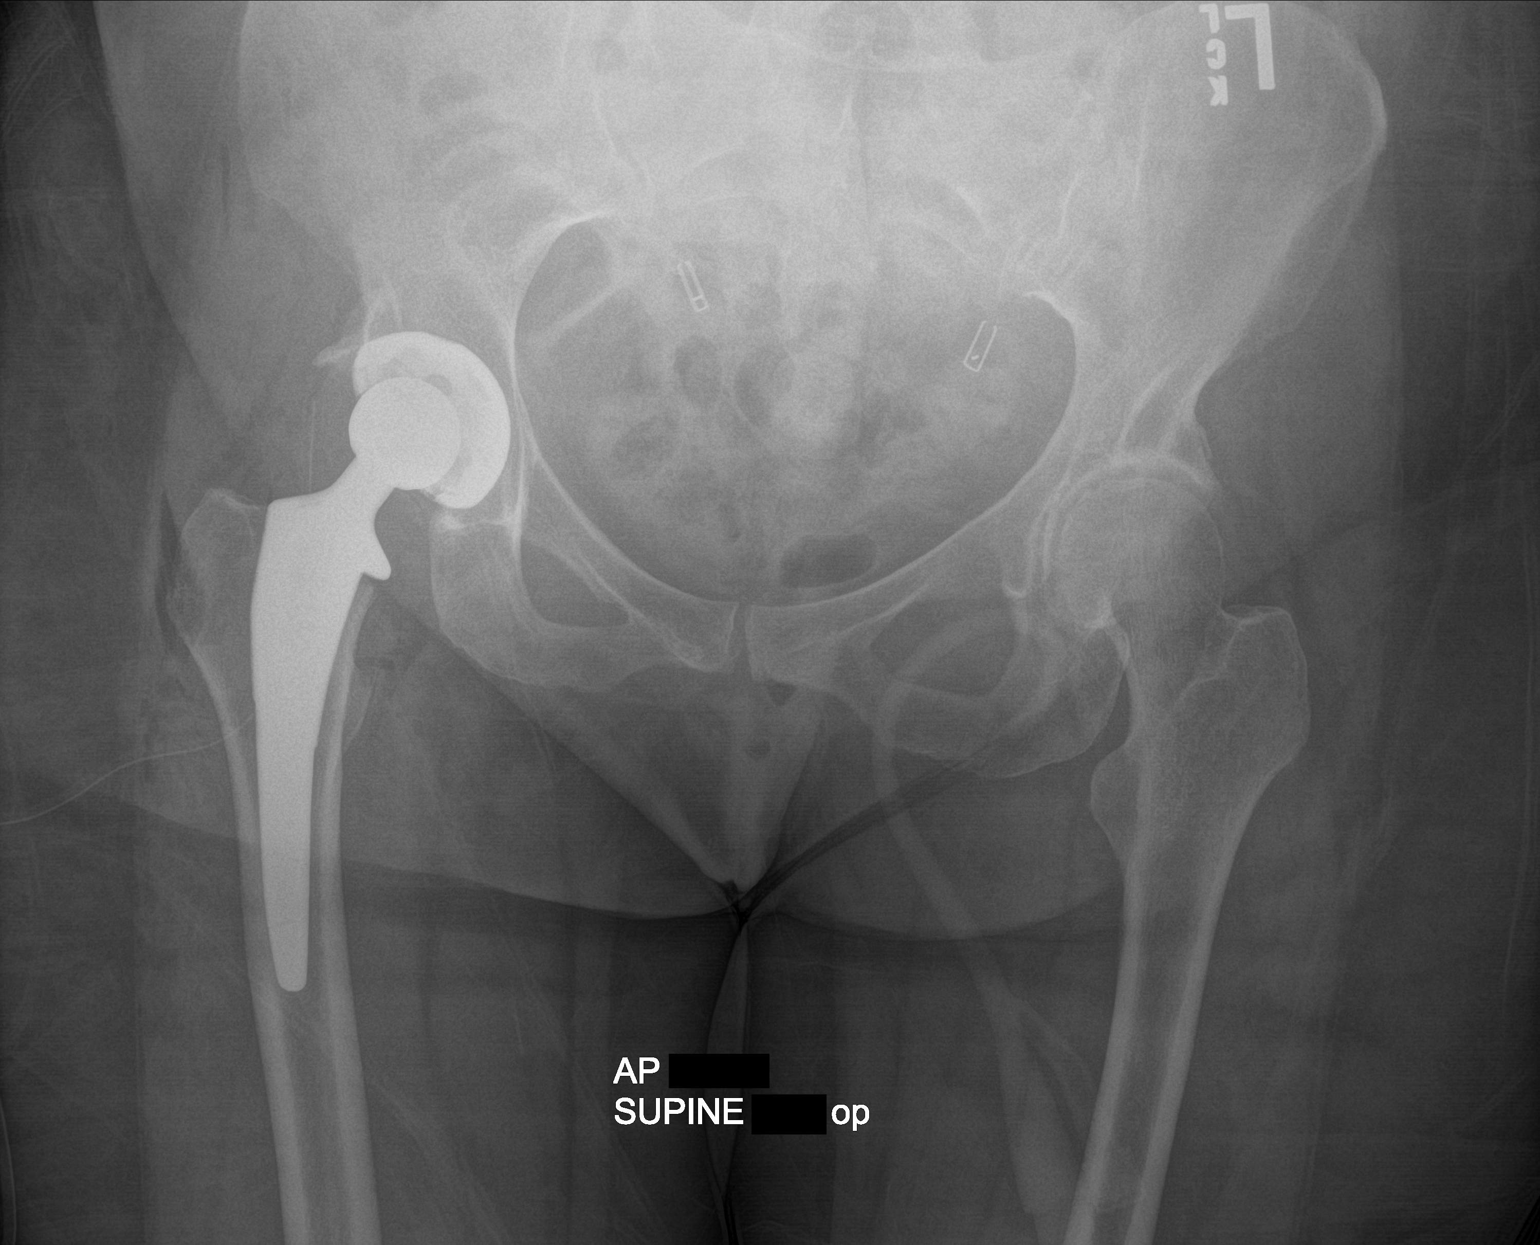

[1 of 1 positions shown; findings below may reference images not displayed]

FINDINGS: Right hip prosthesis is seen. Surgical drain is noted in place. No
soft tissue or acute bony abnormality is noted.
IMPRESSION: Status post right hip replacement

## 2021-08-15 DIAGNOSIS — C44519 Basal cell carcinoma of skin of other part of trunk: Secondary | ICD-10-CM | POA: Diagnosis not present

## 2021-12-03 DIAGNOSIS — J0101 Acute recurrent maxillary sinusitis: Secondary | ICD-10-CM | POA: Diagnosis not present

## 2022-08-01 DIAGNOSIS — Z85828 Personal history of other malignant neoplasm of skin: Secondary | ICD-10-CM | POA: Diagnosis not present

## 2022-08-01 DIAGNOSIS — L814 Other melanin hyperpigmentation: Secondary | ICD-10-CM | POA: Diagnosis not present

## 2022-08-01 DIAGNOSIS — C44619 Basal cell carcinoma of skin of left upper limb, including shoulder: Secondary | ICD-10-CM | POA: Diagnosis not present

## 2022-08-01 DIAGNOSIS — L821 Other seborrheic keratosis: Secondary | ICD-10-CM | POA: Diagnosis not present

## 2022-08-01 DIAGNOSIS — L718 Other rosacea: Secondary | ICD-10-CM | POA: Diagnosis not present

## 2022-09-11 DIAGNOSIS — Z1231 Encounter for screening mammogram for malignant neoplasm of breast: Secondary | ICD-10-CM | POA: Diagnosis not present

## 2022-12-20 DIAGNOSIS — R509 Fever, unspecified: Secondary | ICD-10-CM | POA: Diagnosis not present

## 2022-12-20 DIAGNOSIS — R030 Elevated blood-pressure reading, without diagnosis of hypertension: Secondary | ICD-10-CM | POA: Diagnosis not present

## 2022-12-20 DIAGNOSIS — R059 Cough, unspecified: Secondary | ICD-10-CM | POA: Diagnosis not present

## 2023-02-16 DIAGNOSIS — K219 Gastro-esophageal reflux disease without esophagitis: Secondary | ICD-10-CM | POA: Diagnosis not present

## 2023-02-16 DIAGNOSIS — E039 Hypothyroidism, unspecified: Secondary | ICD-10-CM | POA: Diagnosis not present

## 2023-02-16 DIAGNOSIS — E559 Vitamin D deficiency, unspecified: Secondary | ICD-10-CM | POA: Diagnosis not present

## 2023-02-16 DIAGNOSIS — R5383 Other fatigue: Secondary | ICD-10-CM | POA: Diagnosis not present

## 2023-02-16 DIAGNOSIS — R030 Elevated blood-pressure reading, without diagnosis of hypertension: Secondary | ICD-10-CM | POA: Diagnosis not present

## 2023-03-05 DIAGNOSIS — R059 Cough, unspecified: Secondary | ICD-10-CM | POA: Diagnosis not present

## 2023-03-05 DIAGNOSIS — J209 Acute bronchitis, unspecified: Secondary | ICD-10-CM | POA: Diagnosis not present

## 2023-04-18 DIAGNOSIS — E039 Hypothyroidism, unspecified: Secondary | ICD-10-CM | POA: Diagnosis not present

## 2023-04-18 DIAGNOSIS — R42 Dizziness and giddiness: Secondary | ICD-10-CM | POA: Diagnosis not present

## 2023-04-18 DIAGNOSIS — R5383 Other fatigue: Secondary | ICD-10-CM | POA: Diagnosis not present

## 2023-04-26 DIAGNOSIS — K219 Gastro-esophageal reflux disease without esophagitis: Secondary | ICD-10-CM | POA: Diagnosis not present

## 2023-04-26 DIAGNOSIS — Z1389 Encounter for screening for other disorder: Secondary | ICD-10-CM | POA: Diagnosis not present

## 2023-04-26 DIAGNOSIS — R030 Elevated blood-pressure reading, without diagnosis of hypertension: Secondary | ICD-10-CM | POA: Diagnosis not present

## 2023-04-26 DIAGNOSIS — R5383 Other fatigue: Secondary | ICD-10-CM | POA: Diagnosis not present

## 2023-04-26 DIAGNOSIS — Z23 Encounter for immunization: Secondary | ICD-10-CM | POA: Diagnosis not present

## 2023-04-26 DIAGNOSIS — E039 Hypothyroidism, unspecified: Secondary | ICD-10-CM | POA: Diagnosis not present

## 2023-05-01 DIAGNOSIS — H524 Presbyopia: Secondary | ICD-10-CM | POA: Diagnosis not present

## 2023-05-01 DIAGNOSIS — H35371 Puckering of macula, right eye: Secondary | ICD-10-CM | POA: Diagnosis not present

## 2023-06-20 DIAGNOSIS — E559 Vitamin D deficiency, unspecified: Secondary | ICD-10-CM | POA: Diagnosis not present

## 2023-06-20 DIAGNOSIS — M1990 Unspecified osteoarthritis, unspecified site: Secondary | ICD-10-CM | POA: Diagnosis not present

## 2023-06-20 DIAGNOSIS — E039 Hypothyroidism, unspecified: Secondary | ICD-10-CM | POA: Diagnosis not present

## 2023-06-27 DIAGNOSIS — K219 Gastro-esophageal reflux disease without esophagitis: Secondary | ICD-10-CM | POA: Diagnosis not present

## 2023-06-27 DIAGNOSIS — Z23 Encounter for immunization: Secondary | ICD-10-CM | POA: Diagnosis not present

## 2023-06-27 DIAGNOSIS — R030 Elevated blood-pressure reading, without diagnosis of hypertension: Secondary | ICD-10-CM | POA: Diagnosis not present

## 2023-06-27 DIAGNOSIS — R5383 Other fatigue: Secondary | ICD-10-CM | POA: Diagnosis not present

## 2023-06-27 DIAGNOSIS — E039 Hypothyroidism, unspecified: Secondary | ICD-10-CM | POA: Diagnosis not present

## 2023-08-02 DIAGNOSIS — L814 Other melanin hyperpigmentation: Secondary | ICD-10-CM | POA: Diagnosis not present

## 2023-08-02 DIAGNOSIS — L821 Other seborrheic keratosis: Secondary | ICD-10-CM | POA: Diagnosis not present

## 2023-08-02 DIAGNOSIS — L590 Erythema ab igne [dermatitis ab igne]: Secondary | ICD-10-CM | POA: Diagnosis not present

## 2023-08-02 DIAGNOSIS — Z85828 Personal history of other malignant neoplasm of skin: Secondary | ICD-10-CM | POA: Diagnosis not present

## 2023-08-02 DIAGNOSIS — C44519 Basal cell carcinoma of skin of other part of trunk: Secondary | ICD-10-CM | POA: Diagnosis not present

## 2023-08-09 DIAGNOSIS — E039 Hypothyroidism, unspecified: Secondary | ICD-10-CM | POA: Diagnosis not present

## 2023-08-16 DIAGNOSIS — R5383 Other fatigue: Secondary | ICD-10-CM | POA: Diagnosis not present

## 2023-08-16 DIAGNOSIS — R030 Elevated blood-pressure reading, without diagnosis of hypertension: Secondary | ICD-10-CM | POA: Diagnosis not present

## 2023-08-16 DIAGNOSIS — E039 Hypothyroidism, unspecified: Secondary | ICD-10-CM | POA: Diagnosis not present

## 2023-08-16 DIAGNOSIS — M797 Fibromyalgia: Secondary | ICD-10-CM | POA: Diagnosis not present

## 2023-08-16 DIAGNOSIS — K219 Gastro-esophageal reflux disease without esophagitis: Secondary | ICD-10-CM | POA: Diagnosis not present

## 2023-08-16 DIAGNOSIS — Z23 Encounter for immunization: Secondary | ICD-10-CM | POA: Diagnosis not present

## 2023-11-01 DIAGNOSIS — H35363 Drusen (degenerative) of macula, bilateral: Secondary | ICD-10-CM | POA: Diagnosis not present

## 2023-11-02 DIAGNOSIS — M169 Osteoarthritis of hip, unspecified: Secondary | ICD-10-CM | POA: Diagnosis not present

## 2023-11-02 DIAGNOSIS — E039 Hypothyroidism, unspecified: Secondary | ICD-10-CM | POA: Diagnosis not present

## 2023-11-02 DIAGNOSIS — E559 Vitamin D deficiency, unspecified: Secondary | ICD-10-CM | POA: Diagnosis not present

## 2023-11-08 DIAGNOSIS — E559 Vitamin D deficiency, unspecified: Secondary | ICD-10-CM | POA: Diagnosis not present

## 2023-11-08 DIAGNOSIS — M797 Fibromyalgia: Secondary | ICD-10-CM | POA: Diagnosis not present

## 2023-11-08 DIAGNOSIS — K219 Gastro-esophageal reflux disease without esophagitis: Secondary | ICD-10-CM | POA: Diagnosis not present

## 2023-11-08 DIAGNOSIS — E039 Hypothyroidism, unspecified: Secondary | ICD-10-CM | POA: Diagnosis not present

## 2023-11-08 DIAGNOSIS — R5383 Other fatigue: Secondary | ICD-10-CM | POA: Diagnosis not present

## 2023-11-08 DIAGNOSIS — R030 Elevated blood-pressure reading, without diagnosis of hypertension: Secondary | ICD-10-CM | POA: Diagnosis not present

## 2024-02-04 DIAGNOSIS — Z1231 Encounter for screening mammogram for malignant neoplasm of breast: Secondary | ICD-10-CM | POA: Diagnosis not present

## 2024-03-03 DIAGNOSIS — R5383 Other fatigue: Secondary | ICD-10-CM | POA: Diagnosis not present

## 2024-03-03 DIAGNOSIS — Z13220 Encounter for screening for lipoid disorders: Secondary | ICD-10-CM | POA: Diagnosis not present

## 2024-03-03 DIAGNOSIS — E559 Vitamin D deficiency, unspecified: Secondary | ICD-10-CM | POA: Diagnosis not present

## 2024-03-03 DIAGNOSIS — E039 Hypothyroidism, unspecified: Secondary | ICD-10-CM | POA: Diagnosis not present

## 2024-03-03 DIAGNOSIS — K219 Gastro-esophageal reflux disease without esophagitis: Secondary | ICD-10-CM | POA: Diagnosis not present

## 2024-03-10 DIAGNOSIS — K219 Gastro-esophageal reflux disease without esophagitis: Secondary | ICD-10-CM | POA: Diagnosis not present

## 2024-03-10 DIAGNOSIS — R5383 Other fatigue: Secondary | ICD-10-CM | POA: Diagnosis not present

## 2024-03-10 DIAGNOSIS — E039 Hypothyroidism, unspecified: Secondary | ICD-10-CM | POA: Diagnosis not present

## 2024-04-29 DIAGNOSIS — H35372 Puckering of macula, left eye: Secondary | ICD-10-CM | POA: Diagnosis not present

## 2024-04-29 DIAGNOSIS — H524 Presbyopia: Secondary | ICD-10-CM | POA: Diagnosis not present

## 2024-06-05 DIAGNOSIS — E039 Hypothyroidism, unspecified: Secondary | ICD-10-CM | POA: Diagnosis not present

## 2024-06-05 DIAGNOSIS — R5383 Other fatigue: Secondary | ICD-10-CM | POA: Diagnosis not present

## 2024-06-16 DIAGNOSIS — E039 Hypothyroidism, unspecified: Secondary | ICD-10-CM | POA: Diagnosis not present

## 2024-06-16 DIAGNOSIS — Z0001 Encounter for general adult medical examination with abnormal findings: Secondary | ICD-10-CM | POA: Diagnosis not present

## 2024-06-16 DIAGNOSIS — Z1389 Encounter for screening for other disorder: Secondary | ICD-10-CM | POA: Diagnosis not present

## 2024-06-16 DIAGNOSIS — Z8262 Family history of osteoporosis: Secondary | ICD-10-CM | POA: Diagnosis not present

## 2024-06-16 DIAGNOSIS — Z9189 Other specified personal risk factors, not elsewhere classified: Secondary | ICD-10-CM | POA: Diagnosis not present

## 2024-06-16 DIAGNOSIS — M797 Fibromyalgia: Secondary | ICD-10-CM | POA: Diagnosis not present

## 2024-07-07 DIAGNOSIS — M810 Age-related osteoporosis without current pathological fracture: Secondary | ICD-10-CM | POA: Diagnosis not present

## 2024-07-07 DIAGNOSIS — M8589 Other specified disorders of bone density and structure, multiple sites: Secondary | ICD-10-CM | POA: Diagnosis not present

## 2024-07-07 DIAGNOSIS — Z780 Asymptomatic menopausal state: Secondary | ICD-10-CM | POA: Diagnosis not present

## 2024-07-14 DIAGNOSIS — H35372 Puckering of macula, left eye: Secondary | ICD-10-CM | POA: Diagnosis not present

## 2024-07-22 NOTE — Progress Notes (Signed)
 [facility] - Clinic Note  07/30/2024   CHIEF COMPLAINT Patient presents for Retina Evaluation  HISTORY OF PRESENT ILLNESS: Debbie Lin is a 68 y.o. female who presents to the clinic today for:  HPI     Retina Evaluation   In both eyes.  This started 2 months ago.  Duration of 2 months.  Associated Symptoms Floaters.        Comments   Patient her e for Retina Evaluation. Referred by Dr Debbie Lin. Patient states is ok but not right. Dr Lin saw a wrinkle OD in January. Was seen in June. There was changes. Uses amsler grid. In July noticed a change in OD. Driving could see to drive. Now can't sees as well. Plays the piano could read music with readers now can't. No eye pain.  Not using drops. Has a lot of floaters OU. OS has one always there.      Last edited by Debbie Lin, COA on 07/30/2024  9:31 AM.     Patient states the vision in the right eye is not as clear. She feels that the vision is clearer without the readers.  Referring physician: Moats Debbie CROME, DO [address] [place]. Debbie [place],  [place] [address]  HISTORICAL INFORMATION:  Selected notes from the MEDICAL RECORD NUMBER Referred by Dr. FABIENE Lin LEE:  Ocular Hx- ERM OD, Pseudophakia OU PMH-   CURRENT MEDICATIONS: No current outpatient medications on file. (Ophthalmic Drugs)   No current facility-administered medications for this visit. (Ophthalmic Drugs)   Current Outpatient Medications (Other)  Medication Sig   ALPRAZolam  (XANAX ) 0.5 MG tablet Take 0.5 mg by mouth at bedtime as needed for anxiety.   FLUoxetine  (PROZAC ) 40 MG capsule Take 40 mg by mouth at bedtime.   HYDROcodone -acetaminophen  (NORCO/VICODIN) 5-325 MG tablet Take 1-2 tablets by mouth every 4 (four) hours as needed for severe pain.   levothyroxine  (SYNTHROID ) 50 MCG tablet Take 50 mcg by mouth at bedtime.   methocarbamol  (ROBAXIN ) 500 MG tablet Take 1 tablet (500 mg total) by mouth every 6 (six) hours as needed  for muscle spasms.   No current facility-administered medications for this visit. (Other)   REVIEW OF SYSTEMS: ROS   Positive for: Eyes Last edited by Debbie Lin, COA on 07/30/2024  9:31 AM.     ALLERGIES No Known Allergies PAST MEDICAL HISTORY Past Medical History:  Diagnosis Date   Anxiety    Basal cell carcinoma    Depression    History of uterine prolapse    Hypothyroidism    Migraines    Occ   OA (osteoarthritis)    Past Surgical History:  Procedure Laterality Date   ABDOMINAL HYSTERECTOMY  2018   partial   CHOLECYSTECTOMY     COLONOSCOPY     SKIN CANCER EXCISION     TOTAL HIP ARTHROPLASTY Right 12/17/2019   Procedure: TOTAL HIP ARTHROPLASTY ANTERIOR APPROACH;  Surgeon: Melodi Lerner, MD;  Location: [facility];  Service: Orthopedics;  Laterality: Right;    TUBAL LIGATION     FAMILY HISTORY History reviewed. No pertinent family history. SOCIAL HISTORY Social History   Tobacco Use   Smoking status: Former   Smokeless tobacco: Never   Tobacco comments:    in college  Vaping Use   Vaping status: Never Used  Substance Use Topics   Alcohol use: Yes    Comment: Moderate beer or wine   Drug use: Never  OPHTHALMIC EXAM:  Base Eye Exam     Visual Acuity (Snellen - Linear)       Right Left   Dist cc 20/20 20/20    Correction: Glasses         Tonometry (Tonopen, 9:25 AM)       Right Left   Pressure 12 13         Pupils       Dark Light Shape React APD   Right 3 2 Round Brisk None   Left 3 2 Round Brisk None         Visual Fields (Counting fingers)       Left Right    Full Full         Extraocular Movement       Right Left    Full, Ortho Full, Ortho         Neuro/Psych     Oriented x3: Yes   Mood/Affect: Normal         Dilation     Both eyes: 1.0% Mydriacyl, 2.5% Phenylephrine  @ 9:25 AM           Slit Lamp and Fundus Exam     External Exam       Right Left   External Normal Normal          Slit Lamp Exam       Right Left   Lids/Lashes Normal Normal   Conjunctiva/Sclera White and quiet White and quiet   Cornea Clear Clear   Anterior Chamber Deep and quiet Deep and quiet   Iris Round and reactive Round and reactive   Lens Clear Clear   Anterior Vitreous Normal Normal         Fundus Exam       Right Left   Disc Normal Normal   Macula Normal Normal   Vessels Normal Normal   Periphery Normal Normal           Refraction     Wearing Rx       Sphere Cylinder Axis Add   Right -0.50 +0.50 036 +2.50   Left -2.50 +0.50 087 +2.50           IMAGING AND PROCEDURES  Imaging and Procedures for 07/30/2024        ASSESSMENT/PLAN:   ICD-10-CM   1. Retinal edema  H35.81 OCT, Retina - OU - Both Eyes     Epiretinal membrane, right eye   - recently saw Dr. FABIENE Lin - The natural history, anatomy, potential for loss of vision, and treatment options including vitrectomy techniques and the complications of endophthalmitis, retinal detachment, vitreous hemorrhage, and permanent vision loss discussed with the patient. - OCT shows OD: ERM with central retinal thickening, pucker, and PRF - BCVA 20/20 - asymptomatic, no metamorphopsia - no indication for surgery at this time - monitor for now - f/u 4 mos -- DFE/OCT   2. Pseudophakia OU  - s/p CE/IOL OU 2021  - IOL in good position, doing well  - s/p YAG OU  - monitor     Ophthalmic Meds Ordered this visit:  No orders of the defined types were placed in this encounter.    No follow-ups on file.  There are no Patient Instructions on file for this visit.  Explained the diagnoses, plan, and follow up with the patient and they expressed understanding.  Patient expressed understanding of the importance of proper follow up care.   This document serves as a record  of services personally performed by Debbie JUDITHANN Hans, MD, PhD. It was created on their behalf by Debbie Lin, an ophthalmic technician. The  creation of this record is the provider's dictation and/or activities during the visit.    Electronically signed by: Debbie Lin, OA, 07/30/24  10:04 AM   Debbie Lin, M.D., Ph.D. Diseases & Surgery of the Retina and Vitreous [facility] 07/30/2024  Abbreviations: M myopia (nearsighted); A astigmatism; H hyperopia (farsighted); P presbyopia; Mrx spectacle prescription;  CTL contact lenses; OD right eye; OS left eye; OU both eyes  XT exotropia; ET esotropia; PEK punctate epithelial keratitis; PEE punctate epithelial erosions; DES dry eye syndrome; MGD meibomian gland dysfunction; ATs artificial tears; PFAT's preservative free artificial tears; NSC nuclear sclerotic cataract; PSC posterior subcapsular cataract; ERM epi-retinal membrane; PVD posterior vitreous detachment; RD retinal detachment; DM diabetes mellitus; DR diabetic retinopathy; NPDR non-proliferative diabetic retinopathy; PDR proliferative diabetic retinopathy; CSME clinically significant macular edema; DME diabetic macular edema; dbh dot blot hemorrhages; CWS cotton wool spot; POAG primary open angle glaucoma; C/D cup-to-disc ratio; HVF humphrey visual field; GVF goldmann visual field; OCT optical coherence tomography; IOP intraocular pressure; BRVO Branch retinal vein occlusion; CRVO central retinal vein occlusion; CRAO central retinal artery occlusion; BRAO branch retinal artery occlusion; RT retinal tear; SB scleral buckle; PPV pars plana vitrectomy; VH Vitreous hemorrhage; PRP panretinal laser photocoagulation; IVK intravitreal kenalog; VMT vitreomacular traction; MH Macular hole;  NVD neovascularization of the disc; NVE neovascularization elsewhere; AREDS age related eye disease study; ARMD age related macular degeneration; POAG primary open angle glaucoma; EBMD epithelial/anterior basement membrane dystrophy; ACIOL anterior chamber intraocular lens; IOL intraocular lens; PCIOL posterior chamber intraocular  lens; Phaco/IOL phacoemulsification with intraocular lens placement; PRK photorefractive keratectomy; LASIK laser assisted in situ keratomileusis; HTN hypertension; DM diabetes mellitus; COPD chronic obstructive pulmonary disease

## 2024-07-30 ENCOUNTER — Ambulatory Visit (INDEPENDENT_AMBULATORY_CARE_PROVIDER_SITE_OTHER): Payer: Self-pay | Admitting: Ophthalmology

## 2024-07-30 ENCOUNTER — Encounter (INDEPENDENT_AMBULATORY_CARE_PROVIDER_SITE_OTHER): Payer: Self-pay | Admitting: Ophthalmology

## 2024-07-30 DIAGNOSIS — H35371 Puckering of macula, right eye: Secondary | ICD-10-CM | POA: Diagnosis not present

## 2024-07-30 DIAGNOSIS — Z961 Presence of intraocular lens: Secondary | ICD-10-CM

## 2024-07-30 DIAGNOSIS — H3581 Retinal edema: Secondary | ICD-10-CM

## 2024-07-31 ENCOUNTER — Encounter (INDEPENDENT_AMBULATORY_CARE_PROVIDER_SITE_OTHER): Payer: Self-pay | Admitting: Ophthalmology

## 2024-08-01 DIAGNOSIS — D2271 Melanocytic nevi of right lower limb, including hip: Secondary | ICD-10-CM | POA: Diagnosis not present

## 2024-08-01 DIAGNOSIS — D1801 Hemangioma of skin and subcutaneous tissue: Secondary | ICD-10-CM | POA: Diagnosis not present

## 2024-08-01 DIAGNOSIS — Z85828 Personal history of other malignant neoplasm of skin: Secondary | ICD-10-CM | POA: Diagnosis not present

## 2024-08-01 DIAGNOSIS — L590 Erythema ab igne [dermatitis ab igne]: Secondary | ICD-10-CM | POA: Diagnosis not present

## 2024-08-01 DIAGNOSIS — L820 Inflamed seborrheic keratosis: Secondary | ICD-10-CM | POA: Diagnosis not present

## 2024-08-01 DIAGNOSIS — L821 Other seborrheic keratosis: Secondary | ICD-10-CM | POA: Diagnosis not present

## 2024-08-01 DIAGNOSIS — L814 Other melanin hyperpigmentation: Secondary | ICD-10-CM | POA: Diagnosis not present

## 2024-08-04 DIAGNOSIS — Z1329 Encounter for screening for other suspected endocrine disorder: Secondary | ICD-10-CM | POA: Diagnosis not present

## 2024-08-04 DIAGNOSIS — E7849 Other hyperlipidemia: Secondary | ICD-10-CM | POA: Diagnosis not present

## 2024-08-04 DIAGNOSIS — D519 Vitamin B12 deficiency anemia, unspecified: Secondary | ICD-10-CM | POA: Diagnosis not present

## 2024-08-04 DIAGNOSIS — I10 Essential (primary) hypertension: Secondary | ICD-10-CM | POA: Diagnosis not present

## 2024-08-11 DIAGNOSIS — M797 Fibromyalgia: Secondary | ICD-10-CM | POA: Diagnosis not present

## 2024-08-11 DIAGNOSIS — Z23 Encounter for immunization: Secondary | ICD-10-CM | POA: Diagnosis not present

## 2024-08-11 DIAGNOSIS — E039 Hypothyroidism, unspecified: Secondary | ICD-10-CM | POA: Diagnosis not present

## 2024-11-17 NOTE — Progress Notes (Signed)
 " [facility] - Clinic Note  12/01/2024   CHIEF COMPLAINT Patient presents for Retina Follow Up  HISTORY OF PRESENT ILLNESS: Debbie Lin is a 68 y.o. female who presents to the clinic today for:  HPI     Retina Follow Up   In right eye.  This started 4 months ago.  Duration of 4 months.  Since onset it is stable.  I, the attending physician,  performed the HPI with the patient and updated documentation appropriately.        Comments   4 month retina follow up ERM OD pt is reporting no vision changes noticed she denies flashes has some floaters       Last edited by Valdemar Rogue, MD on 12/01/2024  7:59 PM.     Patient states VA is the same, no changes.   Referring physician: Nicholaus [name], OD   HISTORICAL INFORMATION:  Selected notes from the MEDICAL RECORD NUMBER Referred by Dr. FABIENE Nicholaus for retina eval - ERM OD LEE:  Ocular Hx- ERM OD, Pseudophakia OU PMH-   CURRENT MEDICATIONS: No current outpatient medications on file. (Ophthalmic Drugs)   No current facility-administered medications for this visit. (Ophthalmic Drugs)   Current Outpatient Medications (Other)  Medication Sig   ALPRAZolam  (XANAX ) 0.5 MG tablet Take 0.5 mg by mouth at bedtime as needed for anxiety.   FLUoxetine  (PROZAC ) 40 MG capsule Take 40 mg by mouth at bedtime.   HYDROcodone -acetaminophen  (NORCO/VICODIN) 5-325 MG tablet Take 1-2 tablets by mouth every 4 (four) hours as needed for severe pain.   levothyroxine  (SYNTHROID ) 50 MCG tablet Take 50 mcg by mouth at bedtime.   methocarbamol  (ROBAXIN ) 500 MG tablet Take 1 tablet (500 mg total) by mouth every 6 (six) hours as needed for muscle spasms.   No current facility-administered medications for this visit. (Other)   REVIEW OF SYSTEMS: ROS   Positive for: Eyes Last edited by Resa Delon LELON, COT on 12/01/2024 10:03 AM.      ALLERGIES No Known Allergies PAST MEDICAL HISTORY Past Medical History:  Diagnosis Date    Anxiety    Basal cell carcinoma    Depression    History of uterine prolapse    Hypothyroidism    Migraines    Occ   OA (osteoarthritis)    Past Surgical History:  Procedure Laterality Date   ABDOMINAL HYSTERECTOMY  2018   partial   CHOLECYSTECTOMY     COLONOSCOPY     SKIN CANCER EXCISION     TOTAL HIP ARTHROPLASTY Right 12/17/2019   Procedure: TOTAL HIP ARTHROPLASTY ANTERIOR APPROACH;  Surgeon: Melodi Lerner, MD;  Location: [facility];  Service: Orthopedics;  Laterality: Right;    TUBAL LIGATION     FAMILY HISTORY History reviewed. No pertinent family history. SOCIAL HISTORY Social History   Tobacco Use   Smoking status: Former   Smokeless tobacco: Never   Tobacco comments:    in college  Vaping Use   Vaping status: Never Used  Substance Use Topics   Alcohol use: Yes    Comment: Moderate beer or wine   Drug use: Never       OPHTHALMIC EXAM:  Base Eye Exam     Visual Acuity (Snellen - Linear)       Right Left   Dist [place] 20/20    Dist ph [place]  20/25         Tonometry (Tonopen, 10:07 AM)  Right Left   Pressure 12 12         Pupils       Pupils Dark Light Shape React APD   Right PERRL 3 2 Round Brisk None   Left PERRL 3 2 Round Brisk None         Visual Fields       Left Right    Full Full         Extraocular Movement       Right Left    Full, Ortho Full, Ortho         Neuro/Psych     Oriented x3: Yes   Mood/Affect: Normal         Dilation     Both eyes: 2.5% Phenylephrine  @ 10:07 AM           Slit Lamp and Fundus Exam     External Exam       Right Left   External Normal Normal         Slit Lamp Exam       Right Left   Lids/Lashes Normal Normal   Conjunctiva/Sclera White and quiet White and quiet   Cornea 1+ Punctate epithelial erosions, Well healed cataract wound Trace Debris in tear film, Well healed cataract wound   Anterior Chamber Deep and clear Deep and clear   Iris Round and dilated  Round and dilated   Lens PC IOL in good postion with open PC PC IOL in good postion with open PC   Anterior Vitreous Vitreous syneresis, Posterior vitreous detachment, Vitreous condensation Vitreous syneresis, Posterior vitreous detachment, Vitreous condensation         Fundus Exam       Right Left   Disc Mild Pallor, mild tilt, sharp rim, temp PPA Pink and sharp, Temporal PPA, compact   C/D Ratio 0.4 0.3   Macula Flat, Blunted foveal reflex, ERM w/ mild striae, no heme Flat, Good foveal reflex, No heme or edema   Vessels Vascular attenuation, Tortuous Vascular attenuation   Periphery Attached, No heme Attached, No heme           Refraction     Wearing Rx       Sphere Cylinder Axis Add   Right -0.50 +0.50 036 +2.50   Left -2.50 +0.50 087 +2.50           IMAGING AND PROCEDURES  Imaging and Procedures for 12/01/2024  OCT, Retina - OU - Both Eyes       Right Eye Quality was good. Central Foveal Thickness: 358. Progression has been stable. Findings include no IRF, no SRF, abnormal foveal contour, epiretinal membrane, macular pucker, preretinal fibrosis (ERM with central retinal thickening, pucker, and PRF. ?mild progression of pucker).   Left Eye Quality was good. Central Foveal Thickness: 262. Progression has been stable. Findings include normal foveal contour, no IRF, no SRF.   Notes *Images captured and stored on drive  Diagnosis / Impression:  OD: ERM with central retinal thickening, pucker, and PRF. ?mild progression of pucker OS: NFP; no IRF/SRF   Clinical management:  See below  Abbreviations: NFP - Normal foveal profile. CME - cystoid macular edema. PED - pigment epithelial detachment. IRF - intraretinal fluid. SRF - subretinal fluid. EZ - ellipsoid zone. ERM - epiretinal membrane. ORA - outer retinal atrophy. ORT - outer retinal tubulation. SRHM - subretinal hyper-reflective material. IRHM - intraretinal hyper-reflective material             ASSESSMENT/PLAN:  ICD-10-CM   1. Epiretinal membrane (ERM) of right eye  H35.371 OCT, Retina - OU - Both Eyes    2. Pseudophakia of both eyes  Z96.1      Epiretinal membrane, right eye   - referred by Dr. Willma. Nicholaus - The natural history, anatomy, potential for loss of vision, and treatment options including vitrectomy techniques and the complications of endophthalmitis, retinal detachment, vitreous hemorrhage, and permanent vision loss discussed with the patient. - OCT shows OD: ERM with central retinal thickening, pucker, and PRF, ?mild progression of pucker - BCVA OD 20/20 -- stable - pt reports mild blurring OD, no metamorphopsia - no indication for surgery at this time - cont monitoring - f/u 6-9 mos -- DFE/OCT   2. Pseudophakia OU  - s/p CE/IOL OU 2021  - IOLs in good position, doing well  - s/p YAG OU  - monitor    Ophthalmic Meds Ordered this visit:  No orders of the defined types were placed in this encounter.    Return for 6-9 months ERM OD, DFE, OCT.  There are no Patient Instructions on file for this visit.  Explained the diagnoses, plan, and follow up with the patient and they expressed understanding.  Patient expressed understanding of the importance of proper follow up care.   This document serves as a record of services personally performed by Redell JUDITHANN Hans, MD, PhD. It was created on their behalf by Avelina Pereyra, COA an ophthalmic technician. The creation of this record is the provider's dictation and/or activities during the visit.   Electronically signed by: Avelina GORMAN Pereyra, COT  12/01/2024  8:20 PM   This document serves as a record of services personally performed by Redell JUDITHANN Hans, MD, PhD. It was created on their behalf by Wanda GEANNIE Keens, COT an ophthalmic technician. The creation of this record is the provider's dictation and/or activities during the visit.    Electronically signed by:  Wanda GEANNIE Keens, COT  12/01/2024 8:20 PM  This  document serves as a record of services personally performed by Redell JUDITHANN Hans, MD, PhD. It was created on their behalf by Almetta Pesa, an ophthalmic technician. The creation of this record is the provider's dictation and/or activities during the visit.    Electronically signed by: Almetta Pesa, OA, 12/01/2024  8:20 PM   Redell JUDITHANN Hans, M.D., Ph.D. Diseases & Surgery of the Retina and Vitreous [facility] 12/01/2024  I have reviewed the above documentation for accuracy and completeness, and I agree with the above. Redell JUDITHANN Hans, M.D., Ph.D. 12/01/2024 8:20 PM   Abbreviations: M myopia (nearsighted); A astigmatism; H hyperopia (farsighted); P presbyopia; Mrx spectacle prescription;  CTL contact lenses; OD right eye; OS left eye; OU both eyes  XT exotropia; ET esotropia; PEK punctate epithelial keratitis; PEE punctate epithelial erosions; DES dry eye syndrome; MGD meibomian gland dysfunction; ATs artificial tears; PFAT's preservative free artificial tears; NSC nuclear sclerotic cataract; PSC posterior subcapsular cataract; ERM epi-retinal membrane; PVD posterior vitreous detachment; RD retinal detachment; DM diabetes mellitus; DR diabetic retinopathy; NPDR non-proliferative diabetic retinopathy; PDR proliferative diabetic retinopathy; CSME clinically significant macular edema; DME diabetic macular edema; dbh dot blot hemorrhages; CWS cotton wool spot; POAG primary open angle glaucoma; C/D cup-to-disc ratio; HVF humphrey visual field; GVF goldmann visual field; OCT optical coherence tomography; IOP intraocular pressure; BRVO Branch retinal vein occlusion; CRVO central retinal vein occlusion; CRAO central retinal artery occlusion; BRAO branch retinal artery occlusion; RT retinal tear; SB  scleral buckle; PPV pars plana vitrectomy; VH Vitreous hemorrhage; PRP panretinal laser photocoagulation; IVK intravitreal kenalog; VMT vitreomacular traction; MH Macular hole;  NVD  neovascularization of the disc; NVE neovascularization elsewhere; AREDS age related eye disease study; ARMD age related macular degeneration; POAG primary open angle glaucoma; EBMD epithelial/anterior basement membrane dystrophy; ACIOL anterior chamber intraocular lens; IOL intraocular lens; PCIOL posterior chamber intraocular lens; Phaco/IOL phacoemulsification with intraocular lens placement; PRK photorefractive keratectomy; LASIK laser assisted in situ keratomileusis; HTN hypertension; DM diabetes mellitus; COPD chronic obstructive pulmonary disease  "

## 2024-12-01 ENCOUNTER — Encounter (INDEPENDENT_AMBULATORY_CARE_PROVIDER_SITE_OTHER): Admitting: Ophthalmology

## 2024-12-01 ENCOUNTER — Encounter (INDEPENDENT_AMBULATORY_CARE_PROVIDER_SITE_OTHER): Payer: Self-pay | Admitting: Ophthalmology

## 2024-12-01 DIAGNOSIS — Z961 Presence of intraocular lens: Secondary | ICD-10-CM | POA: Diagnosis not present

## 2024-12-01 DIAGNOSIS — H35371 Puckering of macula, right eye: Secondary | ICD-10-CM

## 2025-07-06 ENCOUNTER — Encounter (INDEPENDENT_AMBULATORY_CARE_PROVIDER_SITE_OTHER): Admitting: Ophthalmology
